# Patient Record
Sex: Male | Born: 1945 | Race: Black or African American | Hispanic: No | Marital: Married | Smoking: Former smoker
Health system: Southern US, Community
[De-identification: ages and names within clinical notes are randomized; demographics above are authoritative.]

## PROBLEM LIST (undated history)

## (undated) ENCOUNTER — Encounter

## (undated) ENCOUNTER — Ambulatory Visit: Payer: MEDICARE

## (undated) ENCOUNTER — Telehealth

## (undated) ENCOUNTER — Encounter: Attending: Surgery | Primary: Surgery

## (undated) ENCOUNTER — Telehealth: Attending: Adult Health | Primary: Adult Health

## (undated) ENCOUNTER — Encounter: Payer: MEDICARE | Attending: Urology | Primary: Urology

## (undated) ENCOUNTER — Encounter: Attending: Cardiovascular Disease | Primary: Cardiovascular Disease

## (undated) ENCOUNTER — Other Ambulatory Visit

## (undated) ENCOUNTER — Ambulatory Visit: Payer: Medicare (Managed Care)

## (undated) ENCOUNTER — Telehealth: Attending: Nurse Practitioner | Primary: Nurse Practitioner

## (undated) ENCOUNTER — Ambulatory Visit

## (undated) ENCOUNTER — Telehealth: Attending: Urology | Primary: Urology

## (undated) ENCOUNTER — Ambulatory Visit: Payer: MEDICARE | Attending: Cardiovascular Disease | Primary: Cardiovascular Disease

## (undated) ENCOUNTER — Encounter: Attending: Urology | Primary: Urology

## (undated) ENCOUNTER — Encounter: Attending: Internal Medicine | Primary: Internal Medicine

## (undated) ENCOUNTER — Encounter: Attending: Adult Health | Primary: Adult Health

## (undated) ENCOUNTER — Non-Acute Institutional Stay: Payer: MEDICARE

## (undated) ENCOUNTER — Encounter
Payer: MEDICARE | Attending: Student in an Organized Health Care Education/Training Program | Primary: Student in an Organized Health Care Education/Training Program

## (undated) ENCOUNTER — Ambulatory Visit: Payer: MEDICARE | Attending: Podiatrist | Primary: Podiatrist

## (undated) ENCOUNTER — Encounter: Payer: MEDICARE | Attending: Nurse Practitioner | Primary: Nurse Practitioner

## (undated) ENCOUNTER — Inpatient Hospital Stay: Payer: Medicare (Managed Care)

## (undated) ENCOUNTER — Encounter: Payer: MEDICARE | Attending: Cardiovascular Disease | Primary: Cardiovascular Disease

## (undated) ENCOUNTER — Encounter: Attending: Nurse Practitioner | Primary: Nurse Practitioner

## (undated) ENCOUNTER — Ambulatory Visit: Payer: MEDICARE | Attending: Urology | Primary: Urology

## (undated) ENCOUNTER — Encounter: Payer: MEDICARE | Attending: Podiatrist | Primary: Podiatrist

## (undated) ENCOUNTER — Telehealth: Attending: Family | Primary: Family

## (undated) ENCOUNTER — Ambulatory Visit: Payer: MEDICARE | Attending: Nurse Practitioner | Primary: Nurse Practitioner

## (undated) ENCOUNTER — Ambulatory Visit
Payer: MEDICARE | Attending: Student in an Organized Health Care Education/Training Program | Primary: Student in an Organized Health Care Education/Training Program

## (undated) ENCOUNTER — Telehealth: Attending: Internal Medicine | Primary: Internal Medicine

## (undated) ENCOUNTER — Encounter
Attending: Student in an Organized Health Care Education/Training Program | Primary: Student in an Organized Health Care Education/Training Program

## (undated) ENCOUNTER — Telehealth: Attending: Cardiovascular Disease | Primary: Cardiovascular Disease

## (undated) ENCOUNTER — Ambulatory Visit: Payer: MEDICARE | Attending: Adult Health | Primary: Adult Health

## (undated) ENCOUNTER — Encounter: Attending: Podiatrist | Primary: Podiatrist

## (undated) ENCOUNTER — Ambulatory Visit: Payer: Medicare (Managed Care) | Attending: Cardiovascular Disease | Primary: Cardiovascular Disease

## (undated) DIAGNOSIS — I10 Essential (primary) hypertension: Secondary | ICD-10-CM

## (undated) DIAGNOSIS — J449 Chronic obstructive pulmonary disease, unspecified: Secondary | ICD-10-CM

## (undated) DIAGNOSIS — I480 Paroxysmal atrial fibrillation: Secondary | ICD-10-CM

## (undated) DIAGNOSIS — E785 Hyperlipidemia, unspecified: Secondary | ICD-10-CM

## (undated) HISTORY — DX: Essential (primary) hypertension: I10

## (undated) HISTORY — DX: Paroxysmal atrial fibrillation: I48.0

## (undated) HISTORY — DX: Hyperlipidemia, unspecified: E78.5

---
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
## 2004-12-27 ENCOUNTER — Emergency Department: Payer: Self-pay | Admitting: Internal Medicine

## 2004-12-28 ENCOUNTER — Ambulatory Visit: Payer: Self-pay | Admitting: Internal Medicine

## 2005-03-14 ENCOUNTER — Ambulatory Visit: Payer: Self-pay | Admitting: Gastroenterology

## 2011-06-10 ENCOUNTER — Emergency Department: Payer: Self-pay | Admitting: Unknown Physician Specialty

## 2011-10-27 ENCOUNTER — Inpatient Hospital Stay: Payer: Self-pay | Admitting: Internal Medicine

## 2011-10-27 LAB — TROPONIN I: Troponin-I: <0.02 ng/mL

## 2011-10-27 LAB — URINALYSIS, COMPLETE
Bilirubin,UR: NEGATIVE
Glucose,UR: NEGATIVE mg/dL (ref 0–75)
Ketone: NEGATIVE
Nitrite: NEGATIVE
Ph: 6 (ref 4.5–8.0)
Protein: 100
RBC,UR: 277 /[HPF] (ref 0–5)
Specific Gravity: 1.026 (ref 1.003–1.030)
Squamous Epithelial: 3
WBC UR: 315 /[HPF] (ref 0–5)

## 2011-10-27 LAB — CK: CK, Total: 400 U/L — ABNORMAL HIGH (ref 35–232)

## 2011-10-28 LAB — CBC WITH DIFFERENTIAL/PLATELET
Basophil #: 0 10*3/uL (ref 0.0–0.1)
Basophil %: 0 %
Eosinophil #: 0 10*3/uL (ref 0.0–0.7)
Eosinophil %: 0.1 %
HCT: 29.9 % — ABNORMAL LOW (ref 40.0–52.0)
HGB: 9.7 g/dL — ABNORMAL LOW (ref 13.0–18.0)
Lymphocyte %: 9.4 %
Lymphs Abs: 1.7 10*3/uL (ref 1.0–3.6)
MCH: 25.7 pg — ABNORMAL LOW (ref 26.0–34.0)
MCHC: 32.3 g/dL (ref 32.0–36.0)
MCV: 79 fL — ABNORMAL LOW (ref 80–100)
Monocyte #: 1.9 10*3/uL — ABNORMAL HIGH (ref 0.0–0.7)
Monocyte %: 10.2 %
Neutrophil #: 14.8 10*3/uL — ABNORMAL HIGH (ref 1.4–6.5)
Neutrophil %: 80.3 %
Platelet: 184 10*3/uL (ref 150–440)
RBC: 3.77 10*6/uL — ABNORMAL LOW (ref 4.40–5.90)
RDW: 17.2 % — ABNORMAL HIGH (ref 11.5–14.5)
WBC: 18.4 10*3/uL — ABNORMAL HIGH (ref 3.8–10.6)

## 2011-10-28 LAB — BASIC METABOLIC PANEL
Anion Gap: 16 (ref 7–16)
BUN: 17 mg/dL (ref 7–18)
Calcium, Total: 7.5 mg/dL — ABNORMAL LOW (ref 8.5–10.1)
Chloride: 97 mmol/L — ABNORMAL LOW (ref 98–107)
Co2: 22 mmol/L (ref 21–32)
Creatinine: 1.5 mg/dL — ABNORMAL HIGH (ref 0.60–1.30)
EGFR (African American): 60 — ABNORMAL LOW
EGFR (Non-African Amer.): 49 — ABNORMAL LOW
Glucose: 125 mg/dL — ABNORMAL HIGH (ref 65–99)
Osmolality: 273 (ref 275–301)
Potassium: 3 mmol/L — ABNORMAL LOW (ref 3.5–5.1)
Sodium: 135 mmol/L — ABNORMAL LOW (ref 136–145)

## 2011-10-28 LAB — CK TOTAL AND CKMB (NOT AT ARMC)
CK, Total: 346 U/L — ABNORMAL HIGH (ref 35–232)
CK, Total: 353 U/L — ABNORMAL HIGH (ref 35–232)
CK-MB: 1.1 ng/mL (ref 0.5–3.6)
CK-MB: 2 ng/mL (ref 0.5–3.6)

## 2011-10-28 LAB — TROPONIN I
Troponin-I: <0.02 ng/mL
Troponin-I: <0.02 ng/mL

## 2011-10-29 LAB — BASIC METABOLIC PANEL
Anion Gap: 15 (ref 7–16)
BUN: 11 mg/dL (ref 7–18)
Calcium, Total: 8 mg/dL — ABNORMAL LOW (ref 8.5–10.1)
Chloride: 99 mmol/L (ref 98–107)
Co2: 20 mmol/L — ABNORMAL LOW (ref 21–32)
Creatinine: 1.19 mg/dL (ref 0.60–1.30)
EGFR (African American): >60
EGFR (Non-African Amer.): >60
Glucose: 103 mg/dL — ABNORMAL HIGH (ref 65–99)
Osmolality: 268 (ref 275–301)
Potassium: 3.3 mmol/L — ABNORMAL LOW (ref 3.5–5.1)
Sodium: 134 mmol/L — ABNORMAL LOW (ref 136–145)

## 2011-10-29 LAB — WBC: WBC: 13.2 10*3/uL — ABNORMAL HIGH (ref 3.8–10.6)

## 2011-10-29 LAB — URINE CULTURE

## 2012-09-13 ENCOUNTER — Telehealth: Payer: Self-pay | Admitting: *Deleted

## 2012-09-13 NOTE — Telephone Encounter (Signed)
Called patient to move his appointment up to 10/11/12 w/ dr. Graciela Husbands as we opened up a day for his schedule.

## 2012-10-01 ENCOUNTER — Inpatient Hospital Stay: Payer: Self-pay | Admitting: Internal Medicine

## 2012-10-01 LAB — COMPREHENSIVE METABOLIC PANEL
Albumin: 3.4 g/dL (ref 3.4–5.0)
Alkaline Phosphatase: 149 U/L — ABNORMAL HIGH (ref 50–136)
Anion Gap: 11 (ref 7–16)
BUN: 20 mg/dL — ABNORMAL HIGH (ref 7–18)
Bilirubin,Total: 1.2 mg/dL — ABNORMAL HIGH (ref 0.2–1.0)
Calcium, Total: 8.5 mg/dL (ref 8.5–10.1)
Chloride: 108 mmol/L — ABNORMAL HIGH (ref 98–107)
Co2: 18 mmol/L — ABNORMAL LOW (ref 21–32)
Creatinine: 1.17 mg/dL (ref 0.60–1.30)
EGFR (African American): >60
EGFR (Non-African Amer.): >60
Glucose: 114 mg/dL — ABNORMAL HIGH (ref 65–99)
Osmolality: 277 (ref 275–301)
Potassium: 4.1 mmol/L (ref 3.5–5.1)
SGOT(AST): 47 U/L — ABNORMAL HIGH (ref 15–37)
SGPT (ALT): 103 U/L — ABNORMAL HIGH (ref 12–78)
Sodium: 137 mmol/L (ref 136–145)
Total Protein: 7 g/dL (ref 6.4–8.2)

## 2012-10-01 LAB — CBC WITH DIFFERENTIAL/PLATELET
Basophil #: 0.1 10*3/uL (ref 0.0–0.1)
Basophil %: 1.2 %
Eosinophil #: 0 10*3/uL (ref 0.0–0.7)
Eosinophil %: 0.6 %
HCT: 38 % — ABNORMAL LOW (ref 40.0–52.0)
HGB: 12 g/dL — ABNORMAL LOW (ref 13.0–18.0)
Lymphocyte %: 38.5 %
Lymphs Abs: 2.5 10*3/uL (ref 1.0–3.6)
MCH: 25 pg — ABNORMAL LOW (ref 26.0–34.0)
MCHC: 31.5 g/dL — ABNORMAL LOW (ref 32.0–36.0)
MCV: 79 fL — ABNORMAL LOW (ref 80–100)
Monocyte #: 0.9 x10 3/mm (ref 0.2–1.0)
Monocyte %: 14.3 %
Neutrophil #: 3 10*3/uL (ref 1.4–6.5)
Neutrophil %: 45.4 %
Platelet: 246 10*3/uL (ref 150–440)
RBC: 4.78 10*6/uL (ref 4.40–5.90)
RDW: 18.2 % — ABNORMAL HIGH (ref 11.5–14.5)
WBC: 6.6 10*3/uL (ref 3.8–10.6)

## 2012-10-01 LAB — LIPID PANEL
Cholesterol: 128 mg/dL (ref 0–200)
HDL Cholesterol: 23 mg/dL — ABNORMAL LOW (ref 40–60)
Ldl Cholesterol, Calc: 86 mg/dL (ref 0–100)
Triglycerides: 95 mg/dL (ref 0–200)
VLDL Cholesterol, Calc: 19 mg/dL (ref 5–40)

## 2012-10-01 LAB — APTT: Activated PTT: 32.7 s (ref 23.6–35.9)

## 2012-10-01 LAB — CK TOTAL AND CKMB (NOT AT ARMC)
CK, Total: 104 U/L (ref 35–232)
CK, Total: 104 U/L (ref 35–232)
CK, Total: 82 U/L (ref 35–232)
CK-MB: 2.3 ng/mL (ref 0.5–3.6)
CK-MB: 2.1 ng/mL (ref 0.5–3.6)
CK-MB: 1.5 ng/mL (ref 0.5–3.6)

## 2012-10-01 LAB — TROPONIN I
Troponin-I: <0.02 ng/mL
Troponin-I: 0.03 ng/mL
Troponin-I: 0.03 ng/mL

## 2012-10-01 LAB — PRO B NATRIURETIC PEPTIDE: B-Type Natriuretic Peptide: 19584 pg/mL — ABNORMAL HIGH (ref 0–125)

## 2012-10-01 LAB — PROTIME-INR
INR: 1
Prothrombin Time: 13.7 s (ref 11.5–14.7)

## 2012-10-01 LAB — MAGNESIUM: Magnesium: 1.7 mg/dL — ABNORMAL LOW

## 2012-10-02 LAB — BASIC METABOLIC PANEL
Anion Gap: 8 (ref 7–16)
BUN: 22 mg/dL — ABNORMAL HIGH (ref 7–18)
Calcium, Total: 8.4 mg/dL — ABNORMAL LOW (ref 8.5–10.1)
Chloride: 108 mmol/L — ABNORMAL HIGH (ref 98–107)
Co2: 25 mmol/L (ref 21–32)
Creatinine: 1.48 mg/dL — ABNORMAL HIGH (ref 0.60–1.30)
EGFR (African American): 56 — ABNORMAL LOW
EGFR (Non-African Amer.): 48 — ABNORMAL LOW
Glucose: 110 mg/dL — ABNORMAL HIGH (ref 65–99)
Osmolality: 285 (ref 275–301)
Potassium: 3.9 mmol/L (ref 3.5–5.1)
Sodium: 141 mmol/L (ref 136–145)

## 2012-10-02 LAB — CBC WITH DIFFERENTIAL/PLATELET
Basophil #: 0 10*3/uL (ref 0.0–0.1)
Basophil %: 0.7 %
Eosinophil #: 0.1 10*3/uL (ref 0.0–0.7)
Eosinophil %: 1.9 %
HCT: 40.3 % (ref 40.0–52.0)
HGB: 12.7 g/dL — ABNORMAL LOW (ref 13.0–18.0)
Lymphocyte %: 35.8 %
Lymphs Abs: 2.1 10*3/uL (ref 1.0–3.6)
MCH: 25 pg — ABNORMAL LOW (ref 26.0–34.0)
MCHC: 31.6 g/dL — ABNORMAL LOW (ref 32.0–36.0)
MCV: 79 fL — ABNORMAL LOW (ref 80–100)
Monocyte #: 0.8 x10 3/mm (ref 0.2–1.0)
Monocyte %: 14 %
Neutrophil #: 2.8 10*3/uL (ref 1.4–6.5)
Neutrophil %: 47.6 %
Platelet: 247 10*3/uL (ref 150–440)
RBC: 5.11 10*6/uL (ref 4.40–5.90)
RDW: 18 % — ABNORMAL HIGH (ref 11.5–14.5)
WBC: 6 10*3/uL (ref 3.8–10.6)

## 2012-10-02 LAB — HEPATIC FUNCTION PANEL A (ARMC)
Albumin: 3.3 g/dL — ABNORMAL LOW (ref 3.4–5.0)
Alkaline Phosphatase: 138 U/L — ABNORMAL HIGH (ref 50–136)
Bilirubin, Direct: 0.3 mg/dL — ABNORMAL HIGH (ref 0.00–0.20)
Bilirubin,Total: 0.9 mg/dL (ref 0.2–1.0)
SGOT(AST): 32 U/L (ref 15–37)
SGPT (ALT): 87 U/L — ABNORMAL HIGH (ref 12–78)
Total Protein: 7 g/dL (ref 6.4–8.2)

## 2012-10-02 LAB — MAGNESIUM: Magnesium: 1.9 mg/dL

## 2012-10-03 LAB — BASIC METABOLIC PANEL
Anion Gap: 9 (ref 7–16)
BUN: 21 mg/dL — ABNORMAL HIGH (ref 7–18)
Calcium, Total: 8.4 mg/dL — ABNORMAL LOW (ref 8.5–10.1)
Chloride: 108 mmol/L — ABNORMAL HIGH (ref 98–107)
Co2: 22 mmol/L (ref 21–32)
Creatinine: 1.27 mg/dL (ref 0.60–1.30)
EGFR (African American): >60
EGFR (Non-African Amer.): 58 — ABNORMAL LOW
Glucose: 102 mg/dL — ABNORMAL HIGH (ref 65–99)
Osmolality: 281 (ref 275–301)
Potassium: 3.8 mmol/L (ref 3.5–5.1)
Sodium: 139 mmol/L (ref 136–145)

## 2012-10-19 ENCOUNTER — Encounter: Payer: Self-pay | Admitting: *Deleted

## 2012-10-23 ENCOUNTER — Ambulatory Visit: Payer: Self-pay | Admitting: Internal Medicine

## 2013-06-04 ENCOUNTER — Ambulatory Visit: Payer: Self-pay | Admitting: Urology

## 2013-06-04 LAB — CBC WITH DIFFERENTIAL/PLATELET
Basophil #: 0.1 10*3/uL (ref 0.0–0.1)
Basophil %: 1.1 %
Eosinophil #: 0.1 10*3/uL (ref 0.0–0.7)
Eosinophil %: 2.1 %
HCT: 37.6 % — ABNORMAL LOW (ref 40.0–52.0)
HGB: 12.1 g/dL — ABNORMAL LOW (ref 13.0–18.0)
Lymphocyte %: 44.2 %
Lymphs Abs: 2.3 10*3/uL (ref 1.0–3.6)
MCH: 27.6 pg (ref 26.0–34.0)
MCHC: 32.1 g/dL (ref 32.0–36.0)
MCV: 86 fL (ref 80–100)
Monocyte #: 0.6 x10 3/mm (ref 0.2–1.0)
Monocyte %: 10.9 %
Neutrophil #: 2.2 10*3/uL (ref 1.4–6.5)
Neutrophil %: 41.7 %
Platelet: 193 10*3/uL (ref 150–440)
RBC: 4.37 10*6/uL — ABNORMAL LOW (ref 4.40–5.90)
RDW: 17.2 % — ABNORMAL HIGH (ref 11.5–14.5)
WBC: 5.2 10*3/uL (ref 3.8–10.6)

## 2013-06-24 ENCOUNTER — Ambulatory Visit: Payer: Self-pay | Admitting: Urology

## 2013-06-26 LAB — PATHOLOGY REPORT

## 2013-10-10 ENCOUNTER — Ambulatory Visit: Payer: Self-pay | Admitting: Urology

## 2014-10-10 IMAGING — US ABDOMEN ULTRASOUND LIMITED
1 series · 14 of 25 positions shown · non-contrast
Comparison: none

REASON FOR EXAM: elevated transminases
COMMENTS:   Body Site: GB and Fossa, CBD, Head of Pancreas

PROCEDURE:     US  - US ABDOMEN LIMITED SURVEY  - October 01, 2012  [DATE]
RESULT:     Comparison: None.
TECHNIQUE: Multiple grayscale and color Doppler images were obtained of the
right upper quadrant.

[Series 1: abdomen ultrasound limited · 0.30mm/px · 14 of 83 slices shown]
[im 1/83]
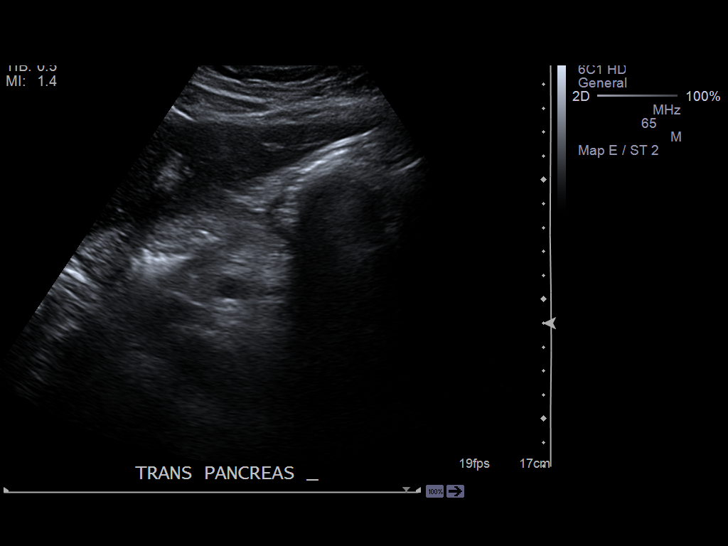
[im 7/83]
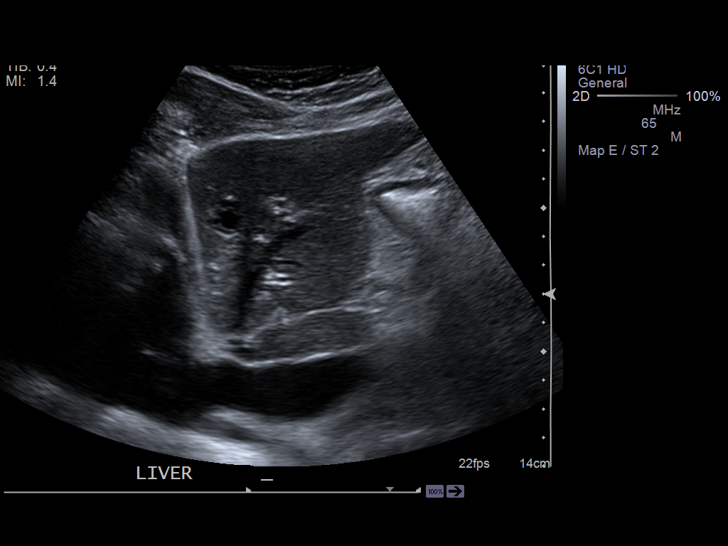
[im 14/83]
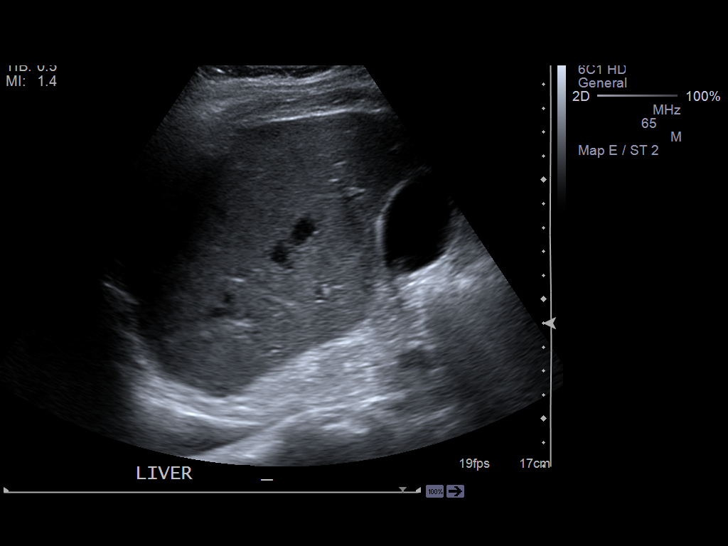
[im 21/83]
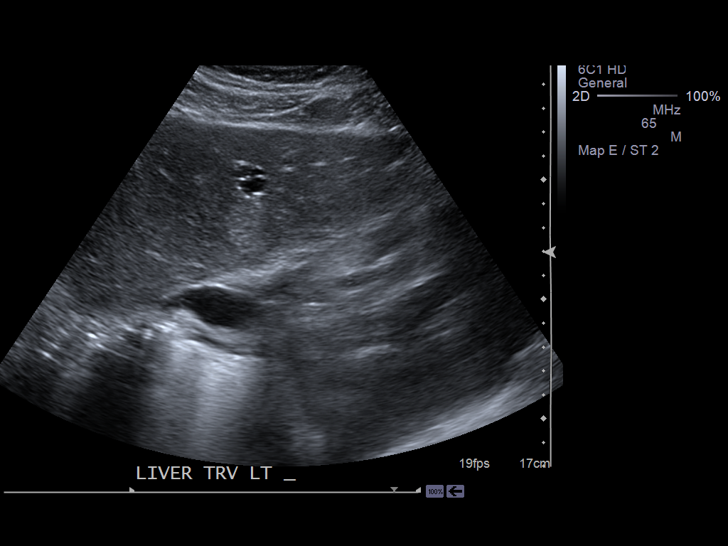
[im 28/83]
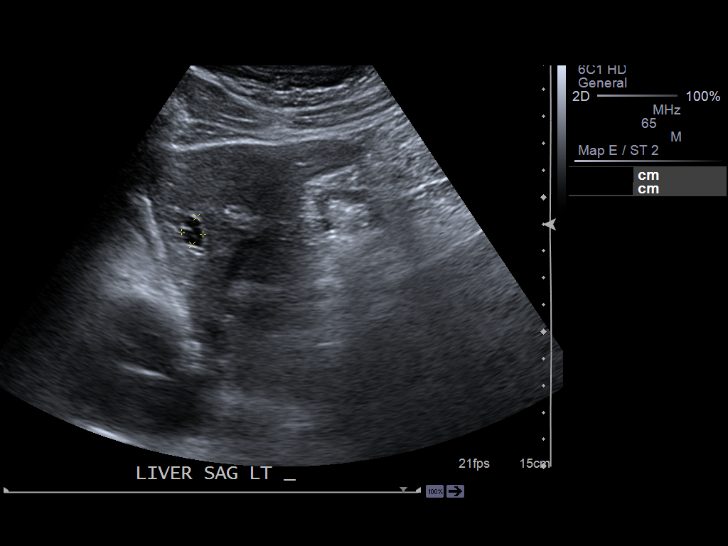
[im 31/83]
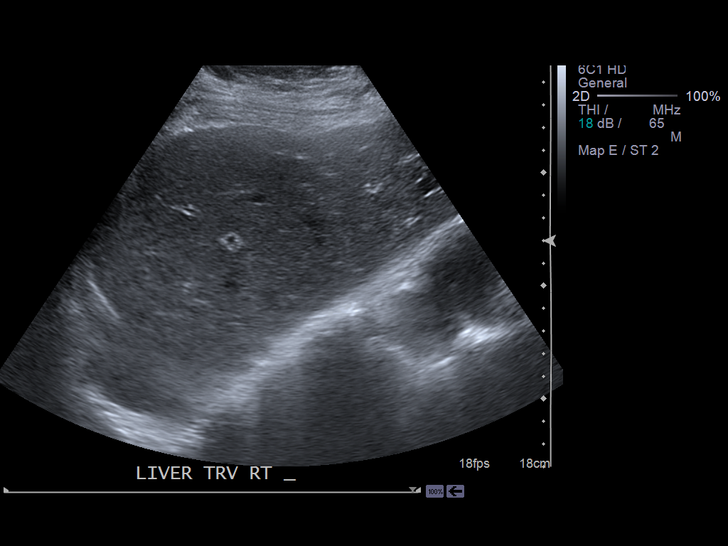
[im 38/83]
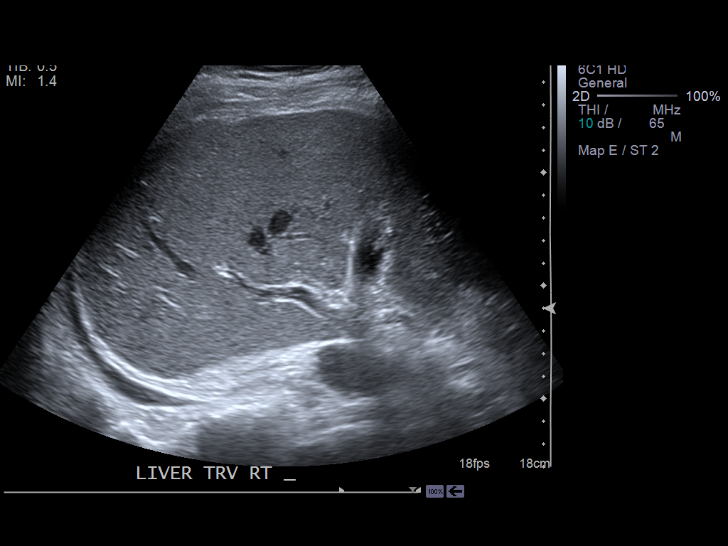
[im 45/83]
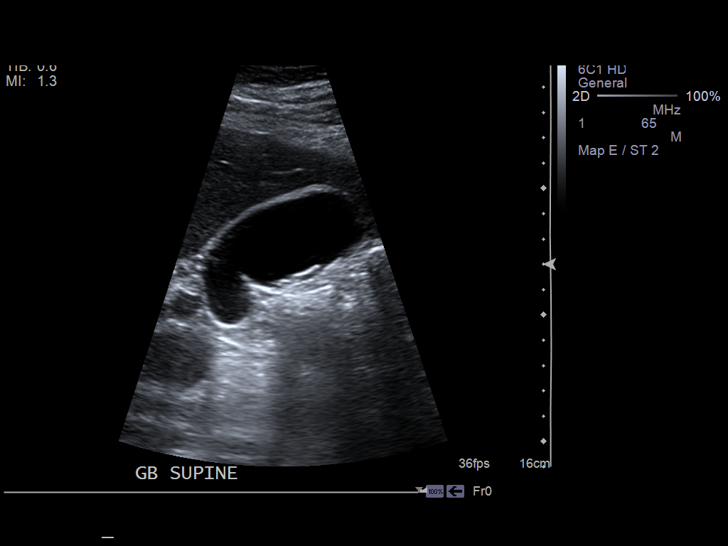
[im 52/83]
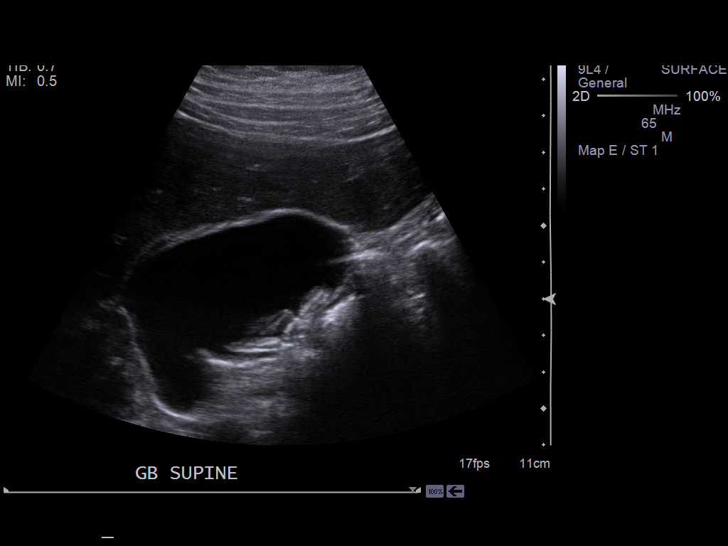
[im 55/83]
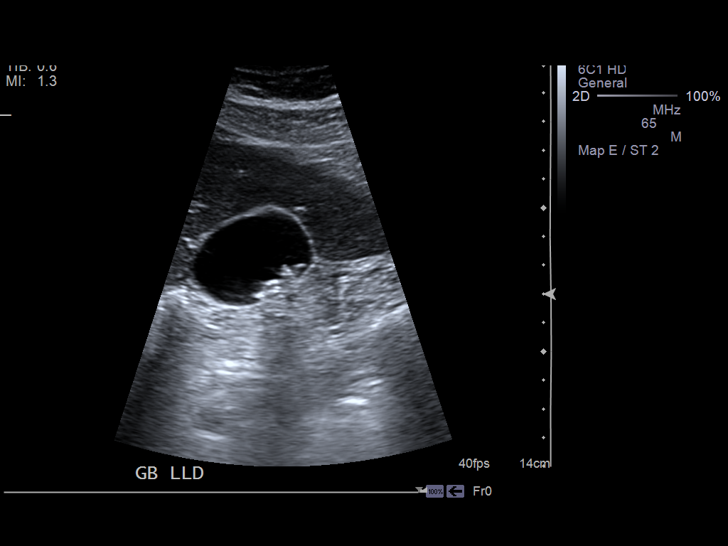
[im 62/83]
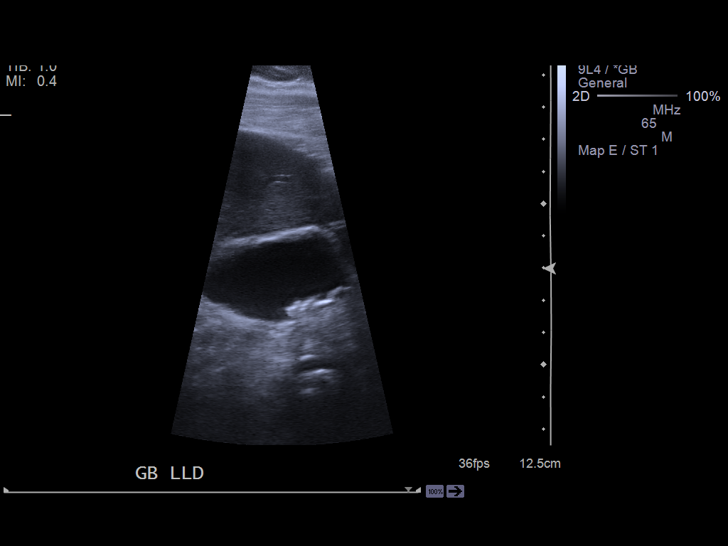
[im 69/83]
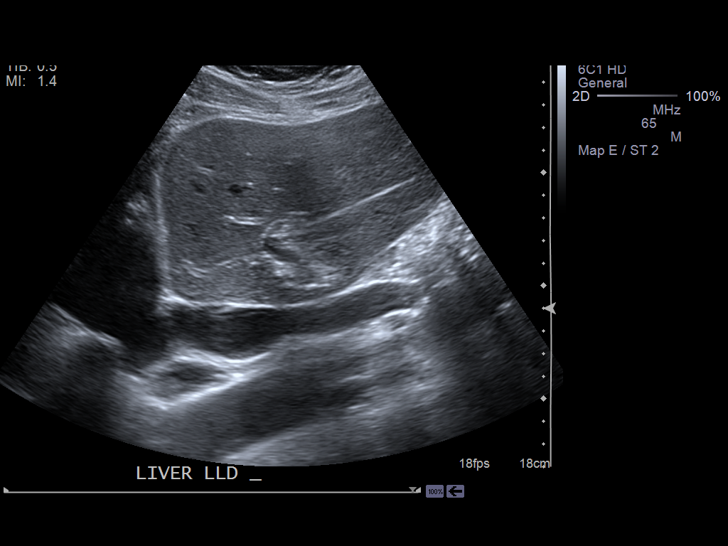
[im 76/83]
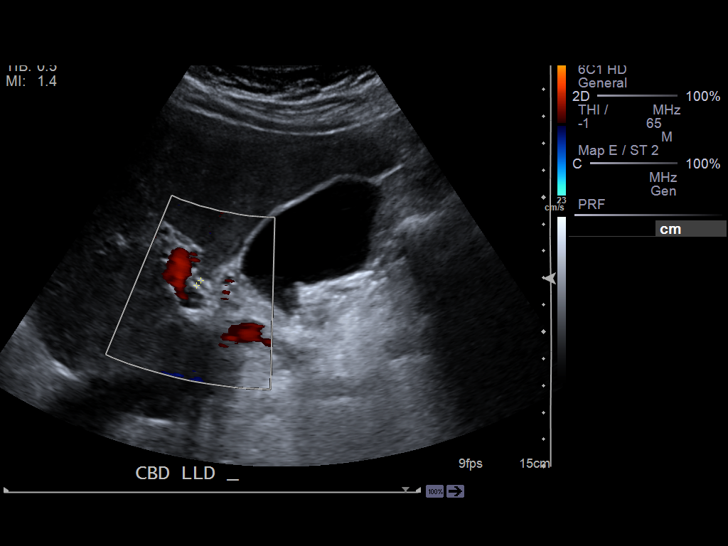
[im 83/83]
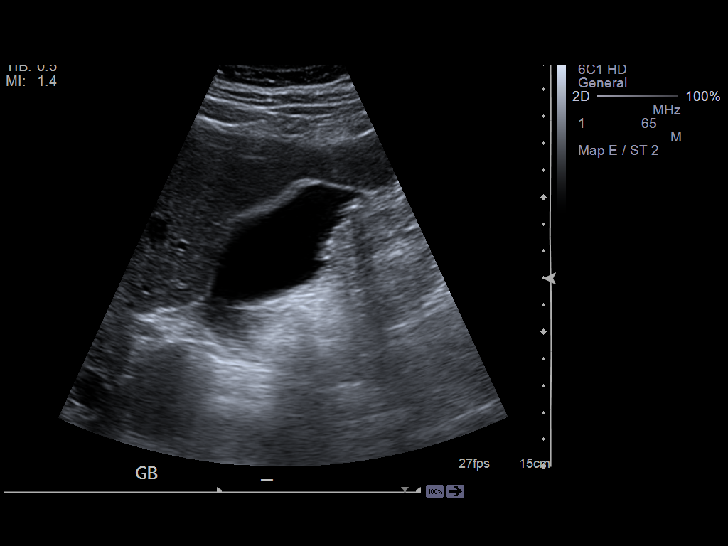

[14 of 25 positions shown; findings below may reference images not displayed]

FINDINGS: The pancreas was mostly obscured by overlying bowel gas. There are 2 small
cysts versus a cyst with thin septation in the left hepatic lobe. It
measures 1.2 x 1.0 x 0.8 cm. Several echogenic foci are identified the
gallbladder. By report, these were mobile, and likely represent nonshadowing
gallstones. No gallbladder wall thickening or pericholecystic fluid. The
sonographic Murphy sign was negative. The common bile duct measures 2 mm in
diameter.

There is a cyst in the superior pole of the right kidney. The right pleural
effusion is incompletely visualized.
IMPRESSION: Cholelithiasis, without sonographic evidence of acute cholecystitis.

[REDACTED]

## 2014-12-12 NOTE — H&P (Signed)
PATIENT NAME:  Drew Walker, Drew Walker MR#:  [id number] DATE OF BIRTH:  1946-07-16  DATE OF ADMISSION:  06/10/2013  CHIEF COMPLAINT: Bloody urine.   HISTORY OF PRESENT ILLNESS: Mr. 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 Upper tract evaluation with IVP indicated normal kidneys. He comes in now for transurethral resection of bladder tumor. Most recent PSA was 6.4. The patient has a history of BPH with LUTZ and is status post TUMT in 2013.   ALLERGIES: No known drug allergies.  CURRENT MEDICATIONS: Include iron, doxycycline, Eliquis, aspirin, metoprolol, diltiazem ER, Cialis, atorvastatin and lisinopril.   PAST SURGICAL HISTORY: Include TUMT in 2013.   SOCIAL HISTORY: The patient smokes a pack a day and has a greater than 30 pack-year history. He denied alcohol use.   FAMILY HISTORY: Remarkable for diabetes and prostate cancer.   PAST AND CURRENT MEDICAL CONDITIONS:  1.  Hypertension.  2.  Diverticulosis.  3.  Erectile dysfunction.  4.  Hypercholesterolemia.   REVIEW OF SYSTEMS: The patient denies chest pain, heart disease, stroke or diabetes.   PHYSICAL EXAMINATION: GENERAL: A well-nourished African American male in no distress.  HEENT: Sclerae were clear. Pupils were equally round and reactive to light and accommodation. Extraocular movements were intact.  NECK: Supple. No palpable masses or tenderness. Thyroid gland was smooth without palpable nodules. No audible carotid bruits.  LUNGS: Clear to auscultation.  HEART: Regular rhythm and rate without audible murmurs.  ABDOMEN: Soft, nontender abdomen.  GENITOURINARY: Uncircumcised. Testes atrophic, 12 mL in size each.  RECTAL: 40 grams smooth nontender prostate.  NEUROMUSCULAR: Alert and oriented x 3.   IMPRESSION: Superficial bladder cancer.   PLAN:  Transurethral resection of bladder tumor. ____________________________ Drew Walker. Drew CroonWolff, MD mrw:sb D: 06/04/2013 13:09:39 ET T: 06/04/2013 13:34:03 ET JOB#: [id number]  cc: Drew Walker. Drew CroonWolff, MD, <Dictator> Drew ApeMICHAEL Walker Katai Marsico MD ELECTRONICALLY SIGNED 06/04/2013 14:21

## 2014-12-12 NOTE — H&P (Signed)
PATIENT NAME:  Drew Walker, Drew Walker MR#:  [id number] DATE OF BIRTH:  [birth year]  DATE OF ADMISSION:  10/01/2012  REFERRING PHYSICIAN: Dr. Malachy Moan.  PRIMARY CARE PHYSICIAN: Used to be Dr. Marguerite Olea, but the patient currently has no primary care physician. He is supposed to be following with Dr. Randa Lynn, but has not seen him yet.   PRIMARY CARDIOLOGIST: Dr. Marcina Millard.   CHIEF COMPLAINT: Shortness of breath, palpitations and chest tightness.   HISTORY OF PRESENT ILLNESS: This is a 69 year old male with significant past medical history of paroxysmal atrial fibrillation, hypertension and BPH. The patient was diagnosed with A. fib in March 2013 where he had CHADS2 score of 1, was on aspirin, heart rate was controlled on beta blockers, metoprolol 100% p.o. b.i.d. The patient presents today with complaints of palpitations, heart racing, chest discomfort and shortness of breath mainly upon lying supine. It started this evening. The patient was noted to be in A. fib with RVR. At one point, heart rate was in the 140s to 150s on elmetry monitor in the Emergency Department, despite receiving 2 doses of IV metoprolol. The patient had elevated proBNP. Chest x-ray did show evidence of volume overload and vascular congestion. As well, he was short of breath requiring oxygen. The patient had complaints of some chest discomfort. He was given 324 mg of aspirin. His first troponin was negative. The patient had seen Dr. Darrold Junker recently, where he had treadmill stress test where he reached 85% of targeted heart rate without any evidence of ischemia. The patient denies any chest pain in particular and described it as more a chest tightness due to the shortness of breath and he reports his shortness of breath is more upon lying supine where he has to sit up, but reports all of these symptoms are currently resolved. The patient had elevated d-dimer at 0.89, but family was concerned about his kidney function, so they  did not want him to have CT chest with IV contrast, but they opted for VQ scan. Hospitalist service was requested to admit the patient for further management of his atrial fibrillation with RVR and heart failure. The patient denies any fever, chills, coffee-ground emesis, bright red blood per rectum, cough or any productive sputum.   PAST MEDICAL HISTORY:  1.  Paroxysmal atrial fibrillation.  2.  Hypertension.  3.  Benign prostatic hypertrophy.   ALLERGIES: No known drug allergies.   SOCIAL HISTORY:  The patient quit smoking 3 weeks ago. He used to smoke about a pack to pack and a half every day. He works as a [employer] and he is still working. No alcohol abuse. No illicit drug abuse. Lives at home with the family.   FAMILY HISTORY: Significant for colon cancer and diabetes.   HOME MEDICATIONS: Aspirin 81 mg oral daily, metoprolol 100 mg oral p.o. b.i.d. and Imdur 30 mg oral daily.   REVIEW OF SYSTEMS:  CONSTITUTIONAL: Denies any fever or chills, weight gain, weight loss, fatigue or weakness.  EYES: Denies blurry vision, double vision, inflammation, glaucoma or cataracts.  ENT: Denies tinnitus, ear pain, hearing loss, epistaxis, discharge or snoring.  RESPIRATORY: Denies any cough, wheezing or hemoptysis. Has dyspnea upon lying supine and dyspnea on exertion. Denies any COPD or asthma.  CARDIOVASCULAR: Complains of chest tightness. Denies any syncope, has complaints of palpitations, heart racing and mild lower extremity edema.  GASTROINTESTINAL: Denies nausea, vomiting, diarrhea, abdominal pain, hematemesis, melena, jaundice or rectal bleed.  GENITOURINARY: Denies dysuria, hematuria or renal colic.  ENDOCRINE: Denies polyuria, polydipsia or heat or cold intolerance.  HEMATOLOGY: Denies anemia, easy bruising or bleeding diathesis.  INTEGUMENTARY: Denies acne, rash or skin lesions.  MUSCULOSKELETAL: Denies neck pain, shoulder pain, knee pain, arthritis, cramps or gout.  NEUROLOGIC: Denies  dysarthria, epilepsy, tremors, vertigo, ataxia, CVA, TIA or seizures.  PSYCHIATRIC: Denies anxiety, insomnia, substance abuse, alcohol abuse, bipolar disorder, schizophrenia or depression.   PHYSICAL EXAMINATION:  VITAL SIGNS: Temperature 97.8, pulse 107, respiratory rate 19, blood pressure 128/98, saturating 98% on oxygen.  GENERAL: Well-nourished male, who looks comfortable and in no apparent distress.  HEENT: Head atraumatic, normocephalic. Pupils equal and reactive to light. Pink conjunctivae. Anicteric sclerae. Moist oral mucosa.  NECK: Supple. No thyromegaly. No JVD.  CHEST: Good air entry bilaterally. No wheezing, rales or rhonchi.  CARDIOVASCULAR: S1, S2 heard. No rubs, murmurs, gallops. Irregular rhythm. Tachycardic.  ABDOMEN: Soft, nontender, and nondistended. No organomegaly.  EXTREMITIES: Mild pitting edema. No clubbing. No cyanosis. No calf tenderness.  SKIN: Normal skin turgor. Warm and dry. No rash.  PSYCHIATRIC: Appropriate affect. Awake and alert x 3. Intact judgment and insight.  NEUROLOGIC: Cranial nerves grossly intact. Motor 5 out of 5. No focal deficits. Sensation symmetrical and intact.   PERTINENT LABORATORY: Glucose 114. BNP 19,584. BUN 20, creatinine 1.17, sodium 137, potassium 4.1, chloride 108, CO2 18. Total bilirubin 1.2, alkaline phosphatase 149, AST 47, ALT 103. Troponin less than 0.02. White blood cell 6.6, hemoglobin 12, hematocrit 38, platelets 246. D-dimer 0.89. EKG showing atrial fibrillation with RVR at 104 beats per minute with new changes and of T wave inversion in lead V6 and aVF from the last EKG in October 2012.   ASSESSMENT AND PLAN:  1.  Atrial fibrillation with rapid ventricular rate. The patient is known to have history of paroxysmal atrial fibrillation. We will continue with metoprolol. The patient was given IV metoprolol in the ED x 2, but still was in rapid ventricular rate. We will continue the patient on his home metoprolol. We will add p.o.  Cardizem. We will give him 1 dose of IV Cardizem now 10 mg and will try to manage with IV Cardizem as needed pushes. Meanwhile, we will continue  with aspirin. We will consult cardiology, Dr. Darrold JunkerParaschos as he is very familiar with the patient. We will leave the decision for anticoagulation up to him. 2.  Congestive heart failure. The patient's last echocardiogram was done in March of last year. He is not known to have history of congestive heart failure. This is most likely related to his atrial fibrillation with rapid ventricular rate. The patient has evidence of vascular congestion in the lung as well as elevated proBNP. We will start him on diuresis. We will start gently intravenous every  12 hours. We will repeat the echocardiogram. We will continue to cycle troponins.  3.  Hypertension, acceptable. We will continue with metoprolol. Will discontinue Imdur as we are adding Cardizem and Lasix.  4.  Elevated liver function tests. This is most likely related to hepatic congestion from his heart failure. Will monitor closely. If no improvement after diuresis, we will check ultrasound of the liver. We will repeat liver function tests in the a.m.  5.  Elevated d-dimer. Discussed with family and at this point they are concerned about ordering  CT of the chest with intravenous contrast because of his kidney function and they are choosing to go with a V/Q scan, so we will order a VQ scan. As well, we will order bilateral lower  extremity venous Dopplers as well.  6.  Deep vein thrombosis prophylaxis. Subcutaneous heparin. 7.  Gastrointestinal prophylaxis. Protonix.  8.  CODE STATUS: Full code.   TOTAL TIME SPENT ON ADMISSION AND PATIENT CARE: 65 minutes.   ____________________________ Starleen Arms, MD dse:aw D: 10/01/2012 07:39:45 ET T: 10/01/2012 08:16:44 ET JOB#: [id number]  cc: Starleen Arms, MD, <Dictator> Jadalee Westcott Teena Irani MD ELECTRONICALLY SIGNED 10/02/2012 1:00

## 2014-12-12 NOTE — Consult Note (Signed)
   General Aspect 69 yo male with history of paroxysmal afib who was admitted with progressive shortness of breath and rapid heart rate. He has been treated with cardizem nd more recently metoprolol.  Pt and his wife statae that he gets shortness of breath after taking po metoprolol. He has been traveling recently and had metoprolol added to his regimen to treat his afib. On presentation to [facility] he had shortness of breath and was noted to be in afib with rvr. He has ruled out for an mi. Echo done today revealed reduced lv funciton with ef less than 40%. He is on asa alone for anticoagulation. Had a chadss score of 1 for hypertension but now has chf which makes his chadss score to 2. He is scheduled ot see Gerre PebblesKevin Thomas for consideraiton for ablation.   Physical Exam:  GEN no acute distress, thin   HEENT PERRL, hearing intact to voice   NECK supple   RESP no use of accessory muscles  rhonchi   CARD Irregular rate and rhythm  Murmur   Murmur Systolic   Systolic Murmur axilla   ABD denies tenderness  no hernia  normal BS   LYMPH negative neck   EXTR negative cyanosis/clubbing, positive edema   SKIN normal to palpation, No rashes   NEURO cranial nerves intact, motor/sensory function intact   PSYCH A+O to time, place, person   Review of Systems:  Subjective/Chief Complaint shortness of breath   General: Fatigue  Weakness   Skin: No Complaints   ENT: No Complaints   Eyes: No Complaints   Neck: No Complaints   Respiratory: Short of breath   Cardiovascular: Palpitations  Dyspnea   Gastrointestinal: No Complaints   Genitourinary: No Complaints   Vascular: No Complaints   Musculoskeletal: No Complaints   Neurologic: No Complaints   Hematologic: No Complaints   Endocrine: No Complaints   Psychiatric: No Complaints   Review of Systems: All other systems were reviewed and found to be negative   Medications/Allergies Reviewed Medications/Allergies reviewed    EKG:  Interpretation afib with variable ventricular response    No Known Allergies:    Impression 69 yo male with history of afib who is now treated with cardizem and meotprolol. Per patient and his wife, he complains of shortness of breath when recieving metoprolol. He appears to tolerate cardizem well. He states he is scheduled for an appointment with Dr. Gerre PebblesKevin Thomas for consideration for an afib ablation. He is in afib with variable ventricular response. Echo done with this admission suggests ef is less than 40% at 25-35%. Pt is on asa for antiplatelet therapy at present. He has a chadss score of 2. Consideration for more aggressive anticoagulation therapy other than asa needs to be addressed. Also, Antiarrythmic therapy such as dofetalide or amiodarone given reduced lv funciton needs to be considered. Will discuss with primary cardiolgist.   Plan 1. Careful diuresis. 2. Conitnue with cardizem 3. Discontinue metoprolol 4. Consider digoxin for rate control 5. Consider chornic anticoagulation with coumadin vs novel anticoagulants. 6. Consider dofetalide vs amiodarone for thryhm control   Electronic Signatures: Dalia HeadingFath, Kenneth A (MD)  (Signed 10-Feb-14 21:22)  Authored: General Aspect/Present Illness, History and Physical Exam, Review of System, EKG , Allergies, Impression/Plan   Last Updated: 10-Feb-14 21:22 by Dalia HeadingFath, Kenneth A (MD)

## 2014-12-12 NOTE — Discharge Summary (Signed)
PATIENT NAME:  Drew Walker, FERRUFINO MR#:  [id number] DATE OF BIRTH:  Mar 25, 1946  DATE OF ADMISSION:  10/01/2012 DATE OF DISCHARGE:  10/03/2012  ADMITTING DIAGNOSES:   1.  Atrial fibrillation, rapid ventricular response.  2.  Congestive heart failure.  3.  Hypertension.  4.  Elevated liver function tests.   DISCHARGE DIAGNOSES:  1.  Atrial fibrillation with rapid ventricular response.  2. Congestive heart failure, left heart, acute systolic with known cardiomyopathy with ejection fraction of 25% to 35% on echo, but normal ejection fraction of Myoview.  3. Abnormal Myoview with suspected anteroseptal lateral wall ischemia.  Ejection fraction of 55%.  4.  Elevated transaminases.  5.  Renal insufficiency with diuretic use, resolved.  6.  Suspected peripheral vascular disease.  7.  Hyperglycemia.  8.  Anemia, microcytic.  9.  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  10.  History of hypertension.  11.  Paroxysmal atrial fibrillation.  12.  Benign prostatic hypertrophy.   DISCHARGE CONDITION:  Stable.   DISCHARGE MEDICATIONS:  The patient is to continue:  1.  Aspirin 81 mg 2 tablets once daily.  2.  Imdur 30 mg by mouth daily.  3.  Metoprolol tartrate 50 mg by mouth twice daily.  4.  Lisinopril 2.5 mg by mouth daily.  5.  Cardizem CD 120 mg by mouth once daily.   HOME OXYGEN:  None.   DIET:  2 gram salt, low fat, low cholesterol, regular consistency.   ACTIVITY LIMITATIONS:  As tolerated.   FOLLOW-UP APPOINTMENT:  To establish primary care physician with Dr. Randa Lynn in two days after discharge.  The patient is also to follow up with cardiology of his choice in one week after discharge.     CONSULTANTS:  Dr. Lady Gary, Dr. Darrold Junker.   RADIOLOGIC STUDIES:  Chest portable single view, 10/01/2012, revealed mild congestive heart failure.  Lung V/Q scan done 10/01/2012, revealed low probability for pulmonary embolus.  Ultrasound of lower extremities bilateral 10/01/2012 revealed no  evidence of DVT in bilateral lower extremities.  Ultrasound of abdomen, limited survey, 10/01/2012, due to elevated transaminases showed cholelithiasis without scintigraphic evidence of acute cholecystitis.  Myoview stress test 10/03/2012 revealed a normal left ventricular function, normal wall motion, anteroseptal lateral wall ischemia, according to cardiologist.  Echocardiogram 10/01/2012 revealed left atrium moderately dilated.  Right atrium mildly dilated.  Ejection fraction of 25% to 35%.  Left ventricular systolic function is moderately reduced.  There is mild mitral regurgitation.  The aortic valve is normal in structure and function.  Mitral valves were grossly normal.  There is mild tricuspid regurgitation.   HOSPITAL COURSE:  The patient is a 69 year old African American male with past medical history significant for history of paroxysmal atrial fibrillation who presented to the hospital with complaints of shortness of breath as well as palpitations and chest tightness.  Please refer to Dr. Teena Irani admission note on 10/01/2012.  On arrival to the hospital, patient's temperature was 97.8, pulse 107, respiration rate was 19, blood pressure 128/98, saturation was 98% on oxygen therapy.  The patient was not in acute distress.  His physical exam revealed mild pitting edema in lower extremity exam.  His chest showed good air entrance bilaterally, but no significant wheezing, rales or rhonchi.  Otherwise, physical exam was unremarkable.  The patient's lab data done on the day of admission 10/01/2012 showed elevated Beta-type natriuretic peptide of 19, BNP 19,584.  The patient's glucose level was 114, BUN was 20. Bicarbonate level was low at  18; otherwise, BMP was unremarkable.  The patient's magnesium level was low at 1.7.  The patient's liver enzymes revealed, however, total bilirubin of 1.2, alkaline phosphatase 149, AST 47, ALT 103.  Cardiac enzymes x 3 did not show any abnormalities.  White blood cell  count was normal at 6.6, hemoglobin was 12.0, platelet count 246, MCV was low at 79, absolute neutrophil count was within normal limits.  The patient's coagulation panel was unremarkable.  D-dimer was slightly elevated at 0.89.  EKG showed atrial fibrillation, rate 104, minimal voltage criteria for LVH, may be normal variant, T wave abnormality. Consider inferior ischemia or digitalis effect according to EKG criteria.  T wave inversions were evident in the inferior as well as lateral leads.  Chest x-ray showed mild congestive heart failure.  The patient was admitted to the hospital.  His cardiac enzymes were cycled x 3 and cardiology consultation with Dr. Lady GaryFath as well as Dr. Darrold JunkerParaschos where obtained.  The cardiologist followed patient along.  It was difficult to discuss the patient's care with patient's family as they were very reluctant to do any kind of studies.  The patient was advised to proceed to cardiac catheterization, especially since he had a history of elevation of troponins during his prior hospitalization with A-Fib.  He also was having some chest discomfort.  After a long discussion with Dr. Darrold JunkerParaschos, family was agreeable for stress test to be repeated.  The patient's stress test showed some changes in his anterior wall which were concerning for possible ischemic changes.  Dr. Darrold JunkerParaschos consulted patient, then advised him to undergo cardiac catheterization; however, patient's family refused that.  Since patient did not have any anginal symptoms and was able to ambulate with no significant discomfort, and his cardiac enzymes remained stable, Dr. Darrold JunkerParaschos recommended to follow up with cardiology of his choice in the next few weeks after discharge.  Dr. Darrold JunkerParaschos will no longer take care of this patient due to major trust issues from the family; however, Dr. Darrold JunkerParaschos felt there was no need for emergent cardiac catheterization.  The patient is being discharged home today on 10/03/2012.  He is to follow  up with Dr. Randa LynnLamb and make decisions about cardiologist, who he wants to follow up with in the next few weeks after discharge.  On day of discharge, 10/03/2012, the patient's vitals, temperature 97.9, pulse ranging from 90s to 106, respirations were 16 to 18, blood pressure 110/76, saturation was 98% on room air at rest.  In regards to atrial fibrillation, patient is to continue Cardizem as well as metoprolol according to Dr. Darrold JunkerParaschos' recommendation.  He is to continue aspirin therapy and he is to follow up with cardiologist for further recommendations and make decisions about anticoagulation if needed.  In regards to cardiomyopathy, the patient's echocardiogram revealed ejection fraction of 25% to 35%; however, patient's Myoview revealed ejection fraction which was completely within normal limits at 55%.  Due to echocardiogram being slightly abnormal, lisinopril was started at lower dose. Should be taken at bedtime.  For elevated transaminases, there was no cholecystitis clinically or radiologically.  Gallstones were noted only on ultrasound of his abdomen.  The patient had hepatitis panel which revealed old hepatitis B infection.  The patient is to follow up with his primary care physician, Dr. Randa LynnLamb, and make decisions about gastroenterology followup as needed.  For suspected peripheral vascular disease, patient overall would benefit from vascular followup in the future.  He was noted to have somewhat decreased peripheral pulses.  For  history of tobacco abuse, patient quit a few weeks ago and he was offered nicotine replacement therapy; however, he refused.  He stated that he completely quit cold [place] and he did not want to restart that.  The patient was noted to be anemic; however, patient's hemoglobin levels remained stable during his stay in the hospital time and he was not noted to have any bleeding.  It is recommended to follow patient's hemoglobin levels as outpatient and make sure that they remain stable  since he is on aspirin therapy.  On day of discharge, 10/03/2012, patient's hemoglobin level is 12.7.  The patient was evaluated for risk factors for coronary artery disease.  He was noted to have LDL of 86.  The patient's total cholesterol was 138, triglycerides were 95 and HDL was 23.  The patient was advised to continue low fat, low cholesterol diet.  The patient is being discharged in stable condition with above-mentioned medications and follow-up.   TIME SPENT:  40 minutes.     ____________________________ Katharina Caper, MD rv:ea D: 10/06/2012 13:33:26 ET T: 10/07/2012 05:43:44 ET JOB#: [id number]  cc: Katharina Caper, MD, <Dictator> Dr. Randa Lynn Outside Physician   Kais Monje MD ELECTRONICALLY SIGNED 10/14/2012 18:03

## 2014-12-12 NOTE — Consult Note (Signed)
PATIENT NAME:  Drew Walker, Drew Walker MR#:  [id number] DATE OF BIRTH:  28-Mar-1946  DATE OF CONSULTATION:  10/03/2012  REFERRING PHYSICIAN:   CONSULTING PHYSICIAN:  Marcina MillardAlexander Arial Galligan, MD  INTERIM NOTE  SUMMARY: The patient is a 69 year old gentleman with history of paroxysmal atrial fibrillation that was treated with metoprolol. The patient was recently [place] where he had an elevated troponin and cardiac catheterization was suggested. The patient was reluctant to undergo cardiac catheterization away from home and was discharged home.  The patient was seen in followup at which time he was clinically stable.  After a lengthy discussion, it was felt to be reasonable to consider [facility] EP evaluation for possible catheter ablation of atrial fibrillation with a rapid ventricular rate. During the interim, the patient developed recurrent atrial fibrillation and presented to [facility] Room where he ruled out for myocardial infarction by CPK isoenzymes and troponin. The patient was seen by Dr. Harold HedgeKenneth Fath in consultation. Echocardiogram revealed mildly reduced left ventricular function. Cardizem was added to his current regimen, which appeared to control his ventricular rate. The patient has CHADS2 score of 1 and was aspirin. Chronic anticoagulation was deferred in light of possible catheter ablation in the near future. Since echocardiogram revealed mildly reduced left ventricular function and the patient had recent hospitalization for elevated troponin, it has seemed reasonable to consider cardiac catheterization to definitely rule out underlying coronary artery disease. On 10/02/2012, I discussed with Drew Walker in person about cardiac catheterization. He appeared to be in agreement to proceed on 10/03/2012.  However, the daughter, who was not present during initial conversation, was very upset, called the telemetry floor and expressed her displeasure with 2 nurses about cardiac  catheterization. She also threatened to call her lawyer and pursue a lawsuit against me for proceeding with cardiac catheterization. She then called the [facility], talked to the receptionist and nurse, and again threatened to pursue legal action against me for suggesting cardiac catheterization. Later the same day, I returned to talk to the patient who again was compliant with my suggestions. However, at this point, I was concerned about the behavior of his daughter. On the morning of 10/03/2012, the patient and his wife were in their room. We discussed what had transpired between his daughter, the nurses on the floor and the clinic, and we agreed to pursue Lexiscan sestamibi study as a compromise to see if the patient has significant myocardial ischemia to further reinforce the decision to pursue cardiac catheterization. The patient underwent Lexiscan sestamibi study on 10/03/2012.  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 I returned to the room to discuss the results. At that time, the wife expressed her displeasure with my care for the patient. She stated that her daughter never threatened the nurses on the floor or the receptionist and nurse at the clinic and that she had already arranged a new physician/cardiologist to take care of Drew Walker. I agree that that would probably be the best course of action considering their lack of confidence in my care of Drew Walker. Throughout all of the discussion, the patient himself never expressed dissatisfaction of my care. We shook hands and I wished him the best. ____________________________ Marcina MillardAlexander Hikaru Delorenzo, MD ap:sb D: 10/03/2012 13:51:57 ET T: 10/03/2012 14:23:11 ET JOB#: [id number]  cc: Marcina MillardAlexander Kohner Orlick, MD, <Dictator> Marcina MillardALEXANDER Renaye Janicki MD ELECTRONICALLY SIGNED 10/16/2012 14:59

## 2014-12-12 NOTE — Op Note (Signed)
PATIENT NAME:  Drew Walker, Drew Walker MR#:  [id number] DATE OF BIRTH:  [birth year]  DATE OF PROCEDURE:  06/24/2013  PREOPERATIVE DIAGNOSIS: Bladder cancer.   POSTOPERATIVE DIAGNOSIS: Bladder cancer.    PROCEDURES: 1. Transurethral resection of bladder tumor.  2. Instillation of mitomycin chemotherapy into the bladder.   SURGEON: Anola GurneyMichael , M.D.  ANESTHETIST: Rice and Evelene Croon.   ANESTHETIC METHOD: General per Rice and local per  Evelene Croon.   INDICATIONS: See the dictated history and physical. After informed consent, the patient requests the above procedures.   OPERATIVE SUMMARY: After adequate general anesthesia had been obtained, the patient was placed into dorsal lithotomy position and the perineum was prepped and draped in the usual fashion. The 24 French resectoscope sheath was then advanced into the bladder with the obturator in place. The resectoscope was then coupled with the camera and then placed into the sheath. The bladder was thoroughly inspected. Both ureteral orifices were identified and had clear efflux. The patient was noted to have a 10 x 10 mm papillary bladder tumor just medial to the right orifice. No other lesions were identified. The bladder was moderately trabeculated. At this point the tumor was resected and fragments were submitted to pathology. Two deep fragments involving bladder muscle were also submitted. Bleeders were controlled with electrocautery. Bladder was then drained and the scope was removed. Ten milliliters of viscous Xylocaine was instilled within the urethra. A 20 French Foley catheter was placed. The catheter was irrigated until clear. Forty milligrams of mitomycin was then instilled into the bladder and the catheter was plugged. An B and O suppository was placed. The procedure was then terminated and the patient was transferred to the recovery room in stable condition.   ____________________________ [name]. Evelene Croon, MD mrw:sg D: 06/24/2013 10:14:28  ET T: 06/24/2013 11:20:23 ET JOB#: [id number]  cc: [name]. Evelene Croon, MD, <Dictator> Orson ApeMICHAEL R  MD ELECTRONICALLY SIGNED 06/24/2013 12:54

## 2014-12-14 NOTE — Consult Note (Signed)
Brief Consult Note: Diagnosis: known paroxismal afib with acute uti and afib with rapid rate now better controlled.   Comments: tx of uti b-blocker and dilt for heart rate control no further cardiac diagnostics at this time consider anticoagulation if needed.  Electronic Signatures: Lamar BlinksKowalski, Nykira Reddix J (MD)  (Signed 07-Mar-13 22:08)  Authored: Brief Consult Note   Last Updated: 07-Mar-13 22:08 by Lamar BlinksKowalski, Shyheim Tanney J (MD)

## 2014-12-14 NOTE — H&P (Signed)
PATIENT NAME:  Drew Walker, Drew R MR#:  [id number] DATE OF BIRTH:  31-Jan-1946  DATE OF ADMISSION:  10/27/2011  PRIMARY CARE PHYSICIAN: Dr. [name].   CHIEF COMPLAINT: Dysuria and fever.   HISTORY OF PRESENT ILLNESS: The patient is a 69 year old male who [facility] due to having chills and fever and also having painful urination. The patient had a recent cystoscopy done about two weeks ago by Dr. [name] for an enlarged prostate. 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 He took some Tylenol for his fever, but it did not alleviate his symptoms; therefore, [facility]. [facility], the patient was noted to have a fever of 100.4; and they did some routine blood work which showed a white cell count of 25,000 and also he was noted to have abnormal urinalysis. The patient was then referred to the hospital for further evaluation and admission. On vitals, the patient was also noted to have an irregular heartbeat, noted to be atrial fibrillation/flutter.   REVIEW OF SYSTEMS: CONSTITUTIONAL: No documented fever presently. No weight gain, no weight loss. EYES: No blurred or double vision. ENT: No tinnitus or postnasal drip. No redness of the oropharynx. RESPIRATORY: No cough, no wheeze, no hemoptysis. CARDIOVASCULAR: No chest pain, no orthopnea, no palpitations, no syncope. GI: No nausea, no vomiting, no diarrhea, no abdominal pain, no melena or hematochezia. GU: Positive dysuria, no hematuria. ENDOCRINE: No polyuria or nocturia. No heat or cold intolerance. HEMATOLOGIC: No anemia. No bruising. No bleeding. INTEGUMENTARY: No rashes. No lesions. MUSCULOSKELETAL: No arthritis, no swelling, and no gout. NEUROLOGIC: No numbness, no tingling, no ataxia, no seizure-type activity. PSYCHIATRIC: No anxiety, no insomnia, no ADD.   PAST MEDICAL HISTORY:  1. Hypertension. 2. Paroxysmal atrial  fibrillation. 3. Benign prostatic hypertrophy.   ALLERGIES: No known drug allergies.   SOCIAL HISTORY: He does smoke about a pack to a pack and a half per day for many years. No alcohol abuse. No illicit drug abuse. He lives at home with his wife.   FAMILY HISTORY: Mother died from complications of colon cancer. Father had also a history of diabetes, so did his mother. Father also died from malignancy.   CURRENT MEDICATIONS:  1. Avodart 0.5 mg daily.  2. HCTZ 25 mg daily.  3. Aspirin 81 mg daily.   PHYSICAL EXAMINATION:  VITAL SIGNS: Presently, temperature is 98.4, pulse 107, respirations 18, blood pressure 96/63, saturations 98% on room air.   GENERAL: He is a pleasant-appearing male in no apparent distress.   HEENT: He is atraumatic, normocephalic. His extraocular muscles are intact. His pupils are equal and reactive to light. His sclerae are anicteric. No conjunctival injection. No oropharyngeal erythema.   NECK: Supple. There is no jugular venous distention. No bruits, no lymphadenopathy or thyromegaly.   HEART: Irregular, no murmurs, no rubs, no clicks.   LUNGS: Clear to auscultation bilaterally. No rales, no rhonchi, no wheezes.   ABDOMEN: Soft, flat, nontender, nondistended, has good bowel sounds. No hepatosplenomegaly appreciated.   EXTREMITIES: There is no evidence of any cyanosis, clubbing, or peripheral edema. Has +2 pedal and radial pulses bilaterally.   NEUROLOGICAL: The patient is alert, awake, and oriented x3 with no focal motor or sensory deficits appreciated bilaterally.   SKIN: Moist and warm with no rash appreciated.   LYMPHATIC: There is no cervical or axillary lymphadenopathy.   LABORATORY, DIAGNOSTIC AND  RADIOLOGICAL DATA: These laboratory exams were done at the [facility].  CBC showed a white cell count of 25,000, hemoglobin 12, hematocrit 36.6, platelet count of 249.  Urinalysis showed positive nitrites, trace leukocyte esterase, 30 to 35 white  cells with many bacteria.  The patient also had a metabolic profile which showed a BUN of 17, creatinine 1.5, sodium of 136, potassium 3.1, chloride 100, bicarbonate 26.   ASSESSMENT AND PLAN: The patient is a 69 year old male with a history of hypertension, paroxysmal atrial fibrillation, history of benign prostatic hypertrophy with recent cystoscopy, presents to the [facility] with fever, chills and dysuria, noted to have urinary tract infection with fever and systemic inflammatory response syndrome, also noted to be in rapid atrial fibrillation.   1. Systemic inflammatory response syndrome: This is likely secondary to the urinary tract infection. The patient is febrile with tachycardia, irregular heartbeat and atrial fibrillation. The patient has received one dose of IM Rocephin at the [facility]. I will also continue IV Rocephin starting tomorrow, follow his cultures, continue IV fluids and follow hemodynamics.  2. Urinary tract infection: As mentioned, the patient did receive some IM Rocephin at the [facility]. I will continue IV Rocephin, follow his cultures.  3. Acute renal failure: Likely in the setting of dehydration and urinary tract infection. I will aggressively hydrate with IV fluids. Continue IV antibiotics. Follow his BUN and creatinine, and his urine output, renal dose his medications, avoid nephrotoxins, and hold hydrochlorothiazide for now.  4. History of benign prostatic hypertrophy: No evidence of any urinary obstruction. Continue Avodart. I will place him on some Pyridium for his dysuria.  5. Paroxysmal atrial fibrillation: The patient has had this in the past. His rates are currently uncontrolled due to infection and systemic inflammatory response syndrome. He was not on any rate-controlling medications prior to coming in. He used to be on Toprol but stopped it a few months ago. I will start him on a low-dose beta blocker, Toprol, also give him a pulse dose of IV Cardizem  and place him on Cardizem every 6 hours. I will check a two-dimensional echocardiogram. I will cycle his cardiac markers, place him on telemetry, and get a Cardiology consult.  6. Leukocytosis secondary to the infection and SIRS: I will follow his white cell count with treatment of IV antibiotics.  CODE STATUS:  The patient is a FULL CODE.     TIME SPENT WITH ADMISSION:  50 minutes.   ____________________________ Rolly Pancake. Cherlynn Kaiser, MD vjs:cbb D: 10/27/2011 15:53:00 ET T: 10/27/2011 16:41:19 ET JOB#: [id number]  cc: Rolly Pancake. Cherlynn Kaiser, MD, <Dictator> Durward Mallard. Marguerite Olea, MD Houston Siren MD ELECTRONICALLY SIGNED 10/27/2011 18:33

## 2014-12-14 NOTE — Discharge Summary (Signed)
PATIENT NAME:  Drew Walker, Drew Walker MR#:  [id number] DATE OF BIRTH:  [birth year]  DATE OF ADMISSION:  10/27/2011 DATE OF DISCHARGE:  10/29/2011  PRESENTING COMPLAINT: Dysuria and fever.   DISCHARGE DIAGNOSES:  1. Systemic inflammatory response syndrome due to Escherichia coli urinary tract infection.  2. Leukocytosis.  3. Acute renal failure.  4. Paroxysmal atrial fibrillation,.  CONDITION ON DISCHARGE: Fair. Vitals stable.   MEDICATIONS: 1. Avodart 0.5 mg p.o. daily.  2. Aspirin 81 mg 2 tablets daily.  3. Rapaflo 8 mg daily.  4. Keflex 500 p.o. t.i.d. for seven days.  5. Cardizem CD 120 mg daily.  6. Patient advised not to take hydrochlorothiazide.   FOLLOW UP: Follow up with Dr. [name] in one week, Dr. Marguerite OleaMoffett in 1 to 2 weeks.   LABORATORY, DIAGNOSTIC AND RADIOLOGICAL DATA: Glucose 103, BUN 11, creatinine 1.1, sodium 134, bicarbonate 20, calcium 8, white count 13.2. Troponin is less than 0.2. Echo showed low normal LVF, left ventricular hypertrophy, mild to moderate MR and TR. White count on admission 18,000. Urine culture grew 20,000 colonies of Escherichia coli sensitive to cephalosporins.   Urinalysis positive for urinary tract infection.   CONSULTATION: Cardiology consultation with Dr. Gwen PoundsKowalski.    BRIEF SUMMARY OF HOSPITAL COURSE: [name] is a pleasant 69 year old gentleman with history of hypertension, paroxysmal atrial fibrillation came in with:  1. Systemic inflammatory response syndrome due to urinary tract infection. Patient recently had cystoscopy done by Dr. [name] for benign prostatic hypertrophy symptoms. Presented with fever, chills and dysuria. He was found to have urinary tract infection, started on IV Rocephin. His Escherichia coli was sensitive to cephalosporin hence changed to p.o. Keflex for total of 10 days.  2. Urinary tract infection, on cephalosporins. Patient will complete a 10 day course as outpatient. Blood cultures remained negative.  3. Acute renal  failure due to dehydration, resolved.  4. Paroxysmal atrial fibrillation, rate uncontrolled due to systemic inflammatory response syndrome and urinary tract infection. Started on Cardizem CD which patient tolerated well. His hydrochlorothiazide was discontinued. He was seen by Dr. Gwen PoundsKowalski. Did not recommend any anticoagulation at present. Patient will continue aspirin.  5. History of benign prostatic hypertrophy. Continue Avodart. Follow up with Dr. [name] as outpatient.  6. Hospital stay otherwise remained stable. CODE STATUS: Patient remained a FULL CODE.   TIME SPENT: 40 minutes.  ____________________________ Wylie HailSona A. Allena KatzPatel, MD sap:cms D: 10/30/2011 12:09:43 ET T: 10/30/2011 12:20:45 ET JOB#: [id number]  cc: Lewi Drost A. Allena KatzPatel, MD, <Dictator> Durward MallardJoel B. Marguerite OleaMoffett, MD Lamar BlinksBruce J. Kowalski, MD [name]. [name], MD Willow OraSONA A Arianis Bowditch MD ELECTRONICALLY SIGNED 11/03/2011 16:04

## 2014-12-14 NOTE — Consult Note (Signed)
PATIENT NAME:  Drew Walker, Drew R MR#:  [id number] DATE OF BIRTH:  05/31/1946  DATE OF CONSULTATION:  10/27/2011  REFERRING PHYSICIAN:  Hilda LiasVivek Sainani, MD CONSULTING PHYSICIAN:  Lamar BlinksBruce J. , MD  REASON FOR CONSULTATION: Atrial fibrillation with rapid ventricular rate with acute renal failure.   CHIEF COMPLAINT: "I have fever."   HISTORY OF PRESENT ILLNESS: This is a 69 year old male who went [facility] clinic [facility] due to having chills and fever and having painful urination. The patient had a recent cystoscopy for which he has had some treatment and it appears that he has had a urinary tract infection with a white count of 25,000. The patient has had some abdominal pain from this urination issue as well. He does have hypertension previously well controlled on appropriate medications. He has also had atrial fibrillation in the past and now has an EKG showing atrial fibrillation with rapid ventricular rate. He has some valvular heart disease and shortness of breath.   REVIEW OF SYSTEMS: The remainder of review of systems is negative for vision change, ringing in the ears, hearing loss, cough, congestion, heartburn, nausea, vomiting, diarrhea, bloody stools, stomach pain, extremity pain, leg weakness, cramping of the buttocks, blood clots, weakness, vision change, skin rash, depression, and anxiety.  PAST MEDICAL HISTORY:  1. Atrial fibrillation.  2. Hypertension.  3. Benign prostatic hypertrophy.   FAMILY HISTORY: Mother died of complications of colon cancer. Father also had diabetes.   SOCIAL HISTORY: He does smoke about 1-1/2 packs a day for many years. He denies alcohol use.   ALLERGIES: He has no known drug allergies.   CURRENT MEDICATIONS: As listed.   PHYSICAL EXAMINATION:   VITAL SIGNS: Blood pressure 96/60 bilaterally and heart rate 72 upright, reclining, and regular.   GENERAL: He is a well appearing male in no acute distress.   HEENT: No icterus,  thyromegaly, ulcers, hemorrhage, or xanthelasma.   HEART: Irregular, irregular with normal S1 and S2 without murmur, gallop, or rub. Point of maximal impulse is normal size and placement. Carotid upstroke is normal without bruit. Jugular venous pressure is normal   LUNGS: Few basilar crackles with normal respirations.   ABDOMEN: Soft and nontender without hepatosplenomegaly or masses. Abdominal aorta is normal size without bruit.   EXTREMITIES: 2+ bilateral pulses in dorsal, pedal, radial, and femoral arteries without lower extremity edema, cyanosis, clubbing, or ulcers.   NEUROLOGIC: He is oriented to time, place, and person with normal mood and affect.   ASSESSMENT: This is a 69 year old male with hypertension, atrial fibrillation, and valvular heart disease with atrial fibrillation with rapid ventricular rate.   RECOMMENDATIONS:  1. Continue treatment of urinary tract infection, which is likely causing current issues of atrial fibrillation.  2. Beta blocker and/or calcium channel blocker for heart rate control and possible spontaneous conversion to normal rhythm.  3. No use of anticoagulation at this point due to concerns of bleeding complications with urinary tract infection and possible hematuria.       4. Further diagnostic testing including echocardiogram.  5. Further treatment options after above.  ____________________________ Lamar BlinksBruce J. , MD bjk:slb D: 11/05/2011 08:31:46 ET T: 11/05/2011 11:37:32 ET JOB#: [id number]  cc: Lamar BlinksBruce J. , MD, <Dictator> Lamar BlinksBRUCE J  MD ELECTRONICALLY SIGNED 11/08/2011 14:03

## 2015-10-19 IMAGING — CT CT ABDOMEN AND PELVIS WITHOUT AND WITH CONTRAST
3 of 10 series · 12 of 46 positions shown, 18 images · IV contrast (isovue)
Comparison: 12/28/2004

CLINICAL DATA: Hematuria.  Previous removal of bladder polyps.

EXAM:
CT ABDOMEN AND PELVIS WITHOUT AND WITH CONTRAST
TECHNIQUE: Multidetector CT imaging of the abdomen and pelvis was performed
without contrast material in one or both body regions, followed by
contrast material(s) and further sections in one or both body
regions.
CONTRAST:  125 mL Isovue 370

[Series 2: hematuria > 45 wo · axial · 0.98mm/px · z∈[-623,-268]mm · 8 of 93 slices shown, 13 images]
[im 11/93  soft-tissue]
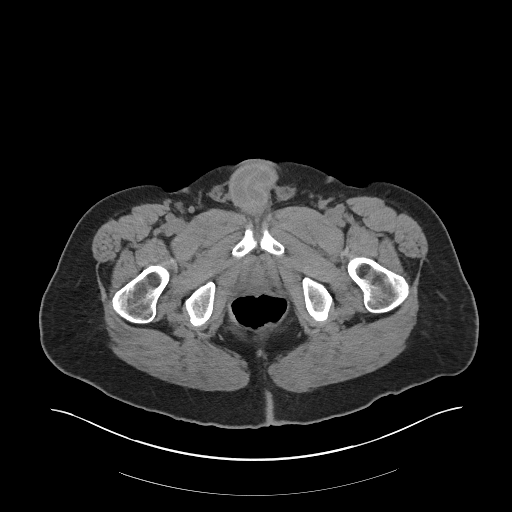
[im 11/93  bone]
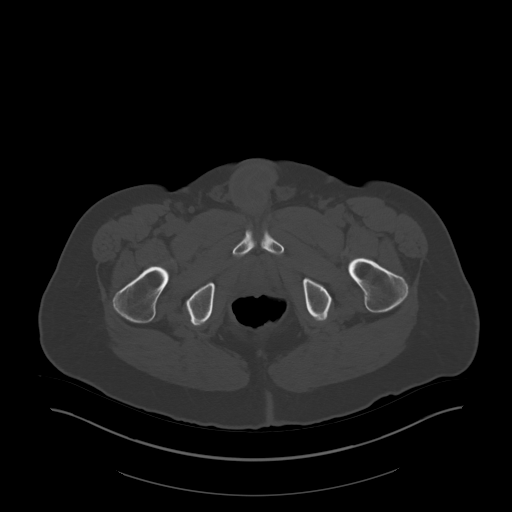
[im 21/93  soft-tissue]
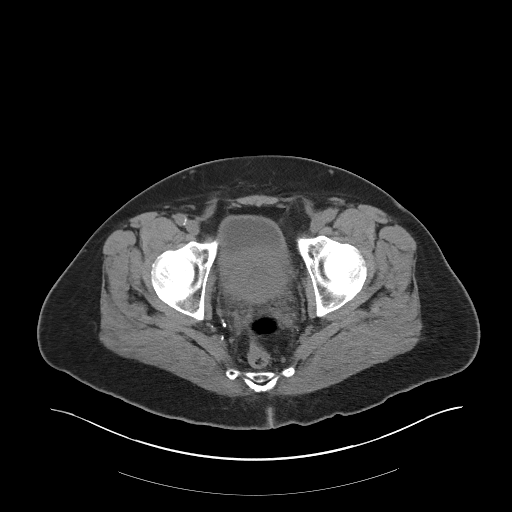
[im 31/93  soft-tissue]
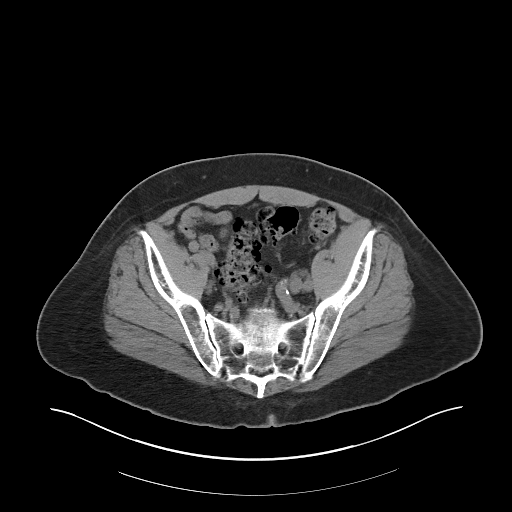
[im 41/93  soft-tissue]
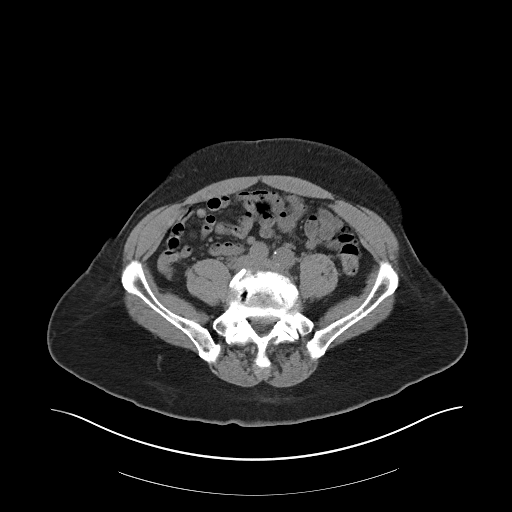
[im 52/93  soft-tissue]
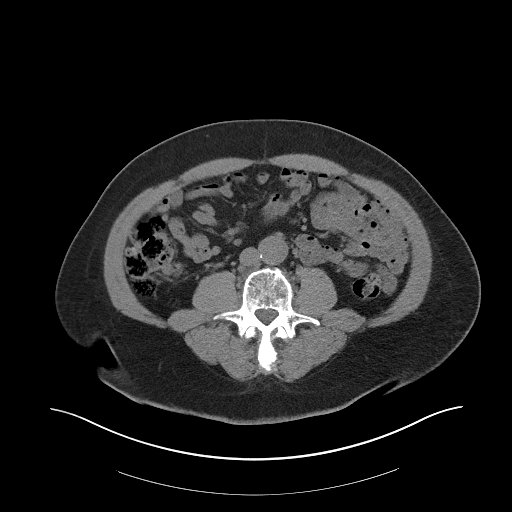
[im 52/93  lung]
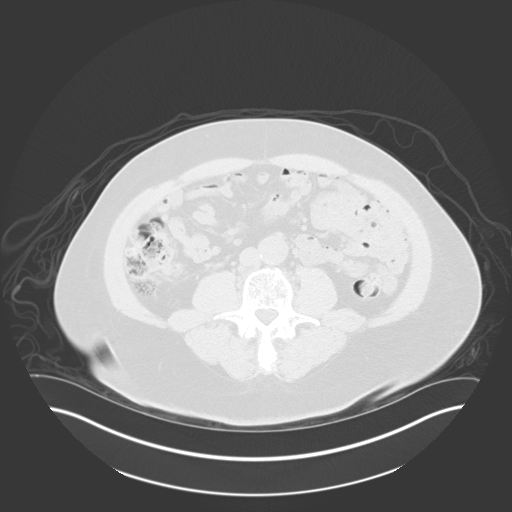
[im 62/93  soft-tissue]
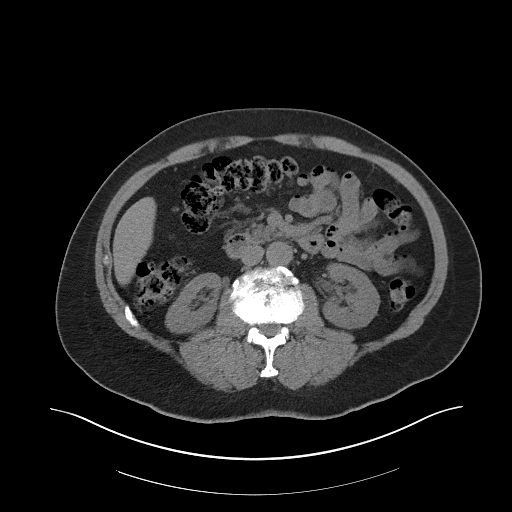
[im 62/93  lung]
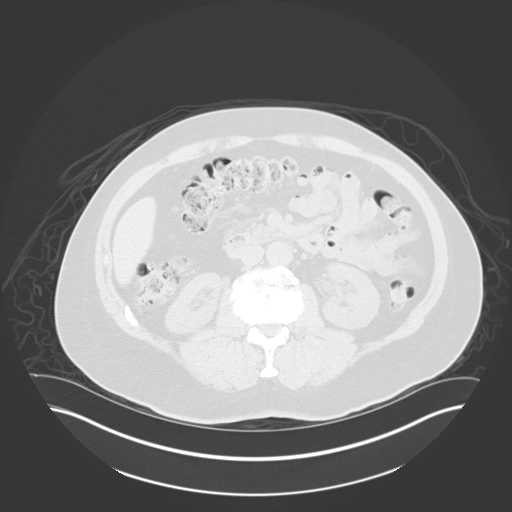
[im 72/93  soft-tissue]
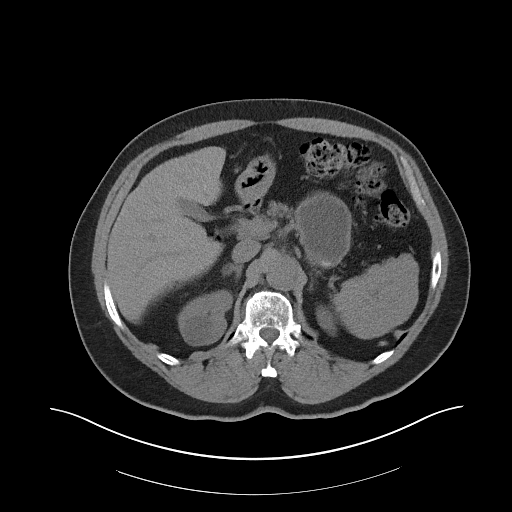
[im 72/93  lung]
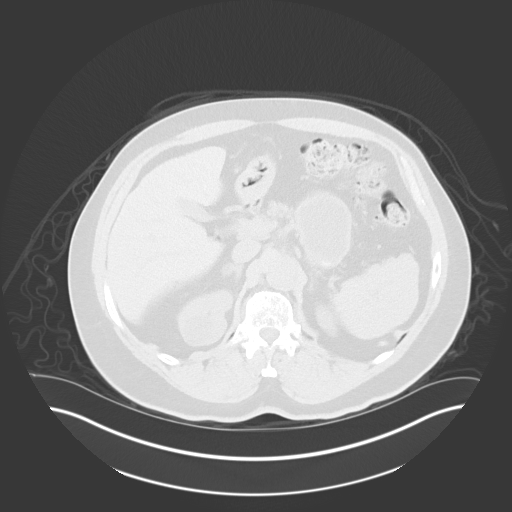
[im 82/93  soft-tissue]
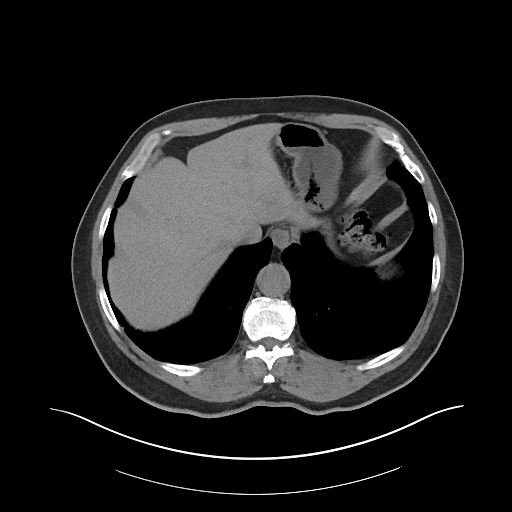
[im 82/93  lung]
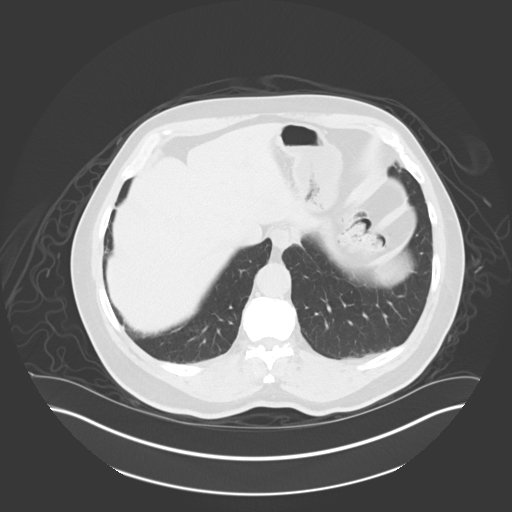

[Series 5: cor hematuria > 45 wo · coronal · 0.88mm/px · 2 of 150 slices shown, 3 images]
[im 50/150  soft-tissue]
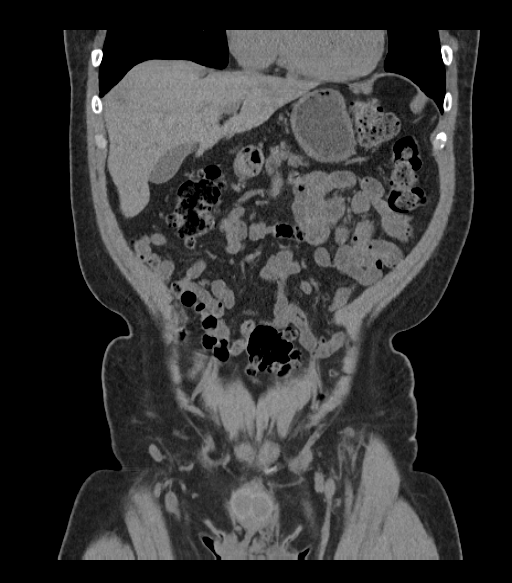
[im 50/150  bone]
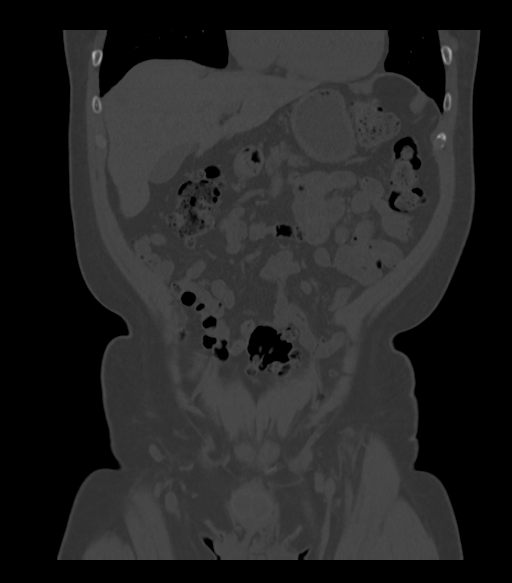
[im 100/150  soft-tissue]
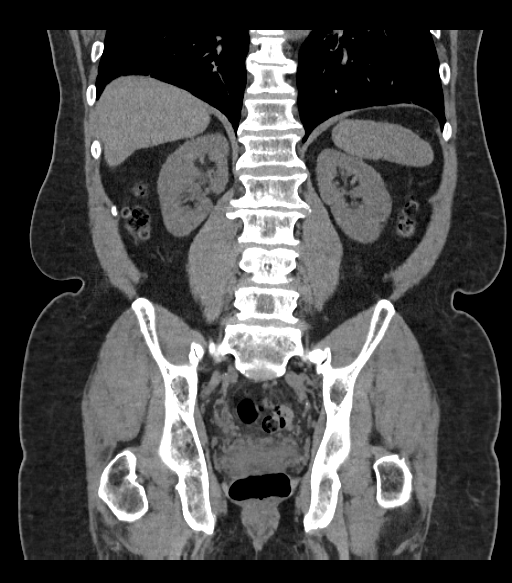

[Series 7: hematuria > 45 with · axial · 0.98mm/px · z∈[-642,-587]mm · 2 of 97 slices shown]
[im 11/97  soft-tissue]
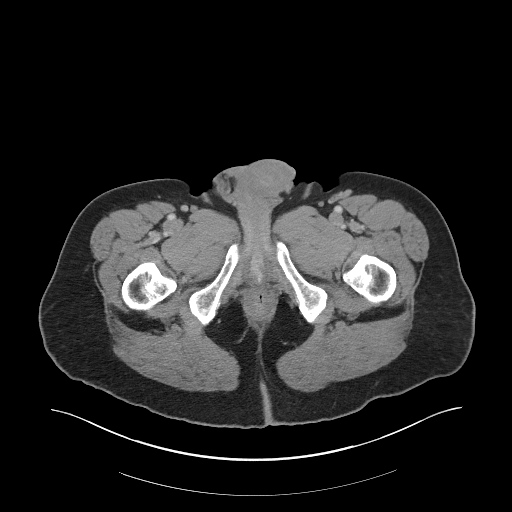
[im 22/97  soft-tissue]
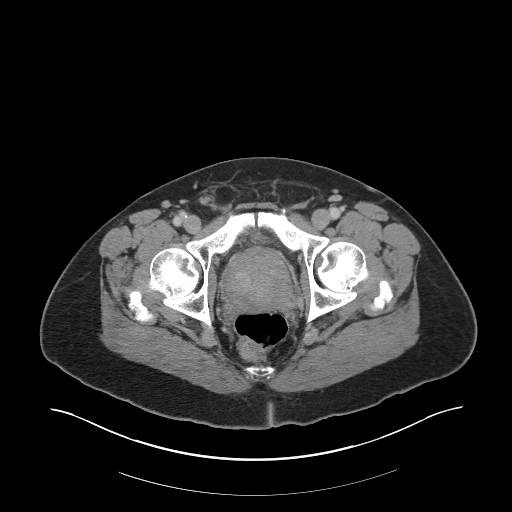

[12 of 46 positions shown; findings below may reference images not displayed]

FINDINGS: Tiny 2-3 mm nonobstructing calculus noted in upper pole of the left
kidney. No other renal calculi identified. No evidence of
hydronephrosis. No evidence of ureteral calculi or dilatation. No
bladder calculi identified.

Post-contrast phases show a 3 cm simple cyst in the upper pole of
the right kidney. No complex cystic or solid masses are seen
involving either kidney. No masses are seen involving the renal
collecting systems opacified portions of ureters, or urinary
bladder. Moderately enlarged prostate gland is seen with mass effect
on bladder base. Seminal vesicles are symmetric.

Small left hepatic lobe cyst is noted but no liver masses are
identified. Cholelithiasis is demonstrated, however there is no
evidence of cholecystitis or biliary dilatation. The pancreas,
spleen, and adrenal glands are normal in appearance.

No other soft tissue masses or lymphadenopathy identified within the
abdomen or pelvis. Normal appendix is visualized. No evidence of
inflammatory process or abnormal fluid collections. Severe
diverticulosis is seen involving the sigmoid colon, however there is
no evidence of diverticulitis. No evidence of dilated bowel loops.
Atherosclerotic plaque noted, however no evidence of abdominal
aortic aneurysm. No suspicious bone lesions identified.
IMPRESSION: Tiny less than 5 mm nonobstructing left upper pole renal calculus.
No evidence of ureteral calculi or hydronephrosis.

No evidence of urinary tract neoplasm.

Moderately enlarged prostate.

Diverticulosis. No radiographic evidence of diverticulitis.

Cholelithiasis. No radiographic evidence of cholecystitis.

## 2016-08-23 ENCOUNTER — Ambulatory Visit (INDEPENDENT_AMBULATORY_CARE_PROVIDER_SITE_OTHER): Payer: Medicare Other | Admitting: Podiatry

## 2016-08-23 ENCOUNTER — Ambulatory Visit (INDEPENDENT_AMBULATORY_CARE_PROVIDER_SITE_OTHER): Payer: Medicare Other

## 2016-08-23 ENCOUNTER — Ambulatory Visit: Payer: Medicare Other

## 2016-08-23 ENCOUNTER — Encounter: Payer: Self-pay | Admitting: Podiatry

## 2016-08-23 VITALS — BP 114/83 | HR 103 | Temp 97.3°F | Resp 16

## 2016-08-23 DIAGNOSIS — M109 Gout, unspecified: Secondary | ICD-10-CM | POA: Diagnosis not present

## 2016-08-23 DIAGNOSIS — M10072 Idiopathic gout, left ankle and foot: Secondary | ICD-10-CM

## 2016-08-23 DIAGNOSIS — M2010 Hallux valgus (acquired), unspecified foot: Secondary | ICD-10-CM

## 2016-08-23 DIAGNOSIS — R52 Pain, unspecified: Secondary | ICD-10-CM

## 2016-08-23 DIAGNOSIS — M2042 Other hammer toe(s) (acquired), left foot: Secondary | ICD-10-CM

## 2016-08-23 DIAGNOSIS — M21619 Bunion of unspecified foot: Secondary | ICD-10-CM

## 2016-08-23 DIAGNOSIS — M2041 Other hammer toe(s) (acquired), right foot: Secondary | ICD-10-CM

## 2016-08-23 MED ORDER — BETAMETHASONE SOD PHOS & ACET 6 (3-3) MG/ML IJ SUSP: 3.0000 mg | Freq: Once | INTRAMUSCULAR | Status: AC

## 2016-08-23 NOTE — Progress Notes (Signed)
   Subjective:    Patient ID: Drew Walker, male    DOB: May 27, 1946, 71 y.o.   MRN: [id number]  HPI    Review of Systems  All other systems reviewed and are negative.      Objective:   Physical Exam        Assessment & Plan:

## 2016-08-23 NOTE — Progress Notes (Signed)
Patient ID: Drew HeldJerry R Ono, male   DOB: 04/05/1946, 71 y.o.   MRN: [id number] Subjective:  Patient presents today as a new patient for evaluation of great toe pain 5 weeks to the left lower extremity. Patient states that his primary care physician treated him for gout. Patient is not diabetic. PCP prescribed prednisolone to alleviate symptoms. Patient states he still has residual pain.    Objective/Physical Exam General: The patient is alert and oriented x3 in no acute distress.  Dermatology: Skin is warm, dry and supple bilateral lower extremities. Negative for open lesions or macerations.  Vascular: Palpable pedal pulses bilaterally. No edema or erythema noted. Capillary refill within normal limits.  Neurological: Epicritic and protective threshold grossly intact bilaterally.   Musculoskeletal Exam: Pain on palpation and range of motion of the first MPJ left foot. Mild edema noted. Range of motion within normal limits to all pedal and ankle joints bilateral. Muscle strength 5/5 in all groups bilateral.  Hallux abductovalgus deformity noted with large prominent bunions bilateral. Hammertoe deformity also noted second digit bilateral  Radiographic Exam:  Joint space narrowing with degenerative joint disease noted to the first MPJ left foot. Increased intermetatarsal angle noted greater than 15. Hallux abductus angle noted greater than 30.  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 #2 injection of 0.5 mL Celestone Soluspan injected in the first MPJ left foot. #3 orders for uric acid levels placed #4 today we did discuss the conservative versus surgical management of bunion deformity. #5 return to clinic in 4 weeks for follow-up evaluation   Felecia ShellingBrent M. Rande Dario, DPM [facility]  Dr. Felecia ShellingBrent M. Acelyn Basham, DPM     [address]                                        [place], [place] [address]                Office [phone]  Fax [phone]

## 2016-09-20 ENCOUNTER — Ambulatory Visit: Payer: Medicare Other | Admitting: Podiatry

## 2016-10-04 ENCOUNTER — Ambulatory Visit: Payer: Medicare Other | Admitting: Podiatry

## 2017-02-24 MED ORDER — VALSARTAN 160 MG TABLET
ORAL_TABLET | Freq: Every day | ORAL | 3 refills | 0.00000 days | Status: CP
Start: 2017-02-24 — End: 2017-08-10

## 2017-03-24 ENCOUNTER — Ambulatory Visit
Admission: RE | Admit: 2017-03-24 | Discharge: 2017-03-24 | Disposition: A | Discharge status: Home / Self Care | Payer: MEDICARE | Attending: Family Medicine | Admitting: Family Medicine

## 2017-03-24 ENCOUNTER — Ambulatory Visit
Admission: RE | Admit: 2017-03-24 | Discharge: 2017-03-24 | Disposition: A | Discharge status: Home / Self Care | Payer: MEDICARE

## 2017-03-24 DIAGNOSIS — R0600 Dyspnea, unspecified: Secondary | ICD-10-CM

## 2017-03-24 DIAGNOSIS — J189 Pneumonia, unspecified organism: Principal | ICD-10-CM

## 2017-03-24 MED ORDER — MOXIFLOXACIN 400 MG TABLET
ORAL_TABLET | Freq: Every day | ORAL | 0 refills | 0.00000 days | Status: CP
Start: 2017-03-24 — End: 2017-04-03

## 2017-03-29 ENCOUNTER — Ambulatory Visit: Admission: RE | Admit: 2017-03-29 | Discharge: 2017-03-29 | Payer: MEDICARE | Attending: Family Medicine

## 2017-03-29 DIAGNOSIS — J189 Pneumonia, unspecified organism: Principal | ICD-10-CM

## 2017-04-13 ENCOUNTER — Ambulatory Visit
Admission: RE | Admit: 2017-04-13 | Discharge: 2017-04-13 | Disposition: A | Discharge status: Home / Self Care | Payer: MEDICARE | Admitting: Urology

## 2017-04-13 DIAGNOSIS — N390 Urinary tract infection, site not specified: Principal | ICD-10-CM

## 2017-04-13 DIAGNOSIS — C679 Malignant neoplasm of bladder, unspecified: Secondary | ICD-10-CM

## 2017-04-25 ENCOUNTER — Ambulatory Visit
Admission: RE | Admit: 2017-04-25 | Discharge: 2017-04-25 | Disposition: A | Discharge status: Home / Self Care | Payer: MEDICARE

## 2017-04-25 DIAGNOSIS — C679 Malignant neoplasm of bladder, unspecified: Principal | ICD-10-CM

## 2017-05-23 ENCOUNTER — Ambulatory Visit
Admission: RE | Admit: 2017-05-23 | Discharge: 2017-05-23 | Disposition: A | Discharge status: Home / Self Care | Payer: MEDICARE

## 2017-05-23 DIAGNOSIS — R0781 Pleurodynia: Principal | ICD-10-CM

## 2017-05-23 DIAGNOSIS — R079 Chest pain, unspecified: Principal | ICD-10-CM

## 2017-05-23 MED ORDER — LIDOCAINE 5 % TOPICAL PATCH
MEDICATED_PATCH | Freq: Two times a day (BID) | TRANSDERMAL | 0 refills | 0 days | Status: CP
Start: 2017-05-23 — End: 2017-11-07

## 2017-06-08 MED ORDER — TIOTROPIUM BROMIDE 18 MCG CAPSULE WITH INHALATION DEVICE
Freq: Every day | RESPIRATORY_TRACT | 3 refills | 0 days | Status: SS
Start: 2017-06-08 — End: 2017-11-11

## 2017-06-13 ENCOUNTER — Ambulatory Visit
Admission: RE | Admit: 2017-06-13 | Discharge: 2017-06-13 | Disposition: A | Discharge status: Home / Self Care | Payer: MEDICARE | Attending: Cardiovascular Disease | Admitting: Cardiovascular Disease

## 2017-06-13 DIAGNOSIS — E785 Hyperlipidemia, unspecified: Principal | ICD-10-CM

## 2017-06-13 DIAGNOSIS — I240 Acute coronary thrombosis not resulting in myocardial infarction: Secondary | ICD-10-CM

## 2017-06-13 MED ORDER — ROSUVASTATIN 5 MG TABLET
ORAL_TABLET | Freq: Every day | ORAL | 3 refills | 0.00000 days | Status: CP
Start: 2017-06-13 — End: 2017-09-19

## 2017-08-10 MED ORDER — LOSARTAN 50 MG TABLET
ORAL_TABLET | Freq: Every day | ORAL | 4 refills | 0 days | Status: CP
Start: 2017-08-10 — End: 2017-11-07

## 2017-09-12 ENCOUNTER — Encounter: Admit: 2017-09-12 | Discharge: 2017-09-13 | Payer: MEDICARE | Attending: Sports Medicine | Primary: Sports Medicine

## 2017-09-12 DIAGNOSIS — R0781 Pleurodynia: Principal | ICD-10-CM

## 2017-09-12 MED ORDER — DICLOFENAC 1 % TOPICAL GEL
Freq: Four times a day (QID) | TOPICAL | 0 refills | 0.00000 days | Status: SS
Start: 2017-09-12 — End: 2017-11-09

## 2017-09-18 ENCOUNTER — Encounter: Admit: 2017-09-18 | Discharge: 2017-09-19 | Payer: MEDICARE

## 2017-09-18 DIAGNOSIS — C3412 Malignant neoplasm of upper lobe, left bronchus or lung: Principal | ICD-10-CM

## 2017-09-19 ENCOUNTER — Ambulatory Visit: Admit: 2017-09-19 | Discharge: 2017-09-19 | Payer: MEDICARE | Attending: Surgery | Primary: Surgery

## 2017-09-19 ENCOUNTER — Ambulatory Visit
Admit: 2017-09-19 | Discharge: 2017-09-19 | Payer: MEDICARE | Attending: Cardiovascular Disease | Primary: Cardiovascular Disease

## 2017-09-19 DIAGNOSIS — R222 Localized swelling, mass and lump, trunk: Principal | ICD-10-CM

## 2017-09-19 DIAGNOSIS — I10 Essential (primary) hypertension: Secondary | ICD-10-CM

## 2017-09-19 DIAGNOSIS — E785 Hyperlipidemia, unspecified: Principal | ICD-10-CM

## 2017-09-19 MED ORDER — EZETIMIBE 10 MG TABLET
ORAL_TABLET | Freq: Every day | ORAL | 3 refills | 0.00000 days | Status: CP
Start: 2017-09-19 — End: 2018-09-19

## 2017-10-03 ENCOUNTER — Ambulatory Visit: Admit: 2017-10-03 | Discharge: 2017-10-04 | Payer: MEDICARE

## 2017-10-03 ENCOUNTER — Encounter: Admit: 2017-10-03 | Discharge: 2017-10-04 | Payer: MEDICARE

## 2017-10-03 ENCOUNTER — Encounter: Admit: 2017-10-03 | Discharge: 2017-10-04 | Payer: MEDICARE | Attending: Surgery | Primary: Surgery

## 2017-10-03 DIAGNOSIS — R590 Localized enlarged lymph nodes: Secondary | ICD-10-CM

## 2017-10-03 DIAGNOSIS — R9389 Abnormal findings on diagnostic imaging of other specified body structures: Secondary | ICD-10-CM

## 2017-10-03 DIAGNOSIS — R222 Localized swelling, mass and lump, trunk: Principal | ICD-10-CM

## 2017-10-03 DIAGNOSIS — C3490 Malignant neoplasm of unspecified part of unspecified bronchus or lung: Principal | ICD-10-CM

## 2017-10-17 ENCOUNTER — Encounter: Admit: 2017-10-17 | Discharge: 2017-10-18 | Payer: MEDICARE | Attending: Surgery | Primary: Surgery

## 2017-10-17 DIAGNOSIS — R222 Localized swelling, mass and lump, trunk: Principal | ICD-10-CM

## 2017-11-07 DIAGNOSIS — R222 Localized swelling, mass and lump, trunk: Principal | ICD-10-CM

## 2017-11-07 DIAGNOSIS — I2510 Atherosclerotic heart disease of native coronary artery without angina pectoris: Principal | ICD-10-CM

## 2017-11-07 DIAGNOSIS — C3490 Malignant neoplasm of unspecified part of unspecified bronchus or lung: Principal | ICD-10-CM

## 2017-11-07 DIAGNOSIS — I10 Essential (primary) hypertension: Secondary | ICD-10-CM

## 2017-11-07 MED ORDER — LOSARTAN 50 MG TABLET
ORAL_TABLET | Freq: Every day | ORAL | 4 refills | 0.00000 days | Status: CP
Start: 2017-11-07 — End: 2018-03-07

## 2017-11-07 MED ORDER — ROSUVASTATIN 5 MG TABLET
ORAL_TABLET | Freq: Every day | ORAL | 6 refills | 0.00000 days
Start: 2017-11-07 — End: 2018-04-18

## 2017-11-08 DIAGNOSIS — R6889 Other general symptoms and signs: Secondary | ICD-10-CM

## 2017-11-08 DIAGNOSIS — N390 Urinary tract infection, site not specified: Principal | ICD-10-CM

## 2017-11-08 DIAGNOSIS — N12 Tubulo-interstitial nephritis, not specified as acute or chronic: Principal | ICD-10-CM

## 2017-11-08 DIAGNOSIS — R509 Fever, unspecified: Secondary | ICD-10-CM

## 2017-11-08 DIAGNOSIS — Z87440 Personal history of urinary (tract) infections: Secondary | ICD-10-CM

## 2017-11-09 ENCOUNTER — Ambulatory Visit
Admit: 2017-11-09 | Discharge: 2017-11-11 | Disposition: A | Discharge status: Home / Self Care | Payer: MEDICARE | Attending: Cardiovascular Disease

## 2017-11-09 ENCOUNTER — Ambulatory Visit: Admit: 2017-11-09 | Discharge: 2017-11-11 | Disposition: A | Discharge status: Home / Self Care | Payer: MEDICARE

## 2017-11-09 ENCOUNTER — Ambulatory Visit
Admit: 2017-11-09 | Discharge: 2017-11-11 | Disposition: A | Discharge status: Home / Self Care | Payer: MEDICARE | Attending: Surgery

## 2017-11-09 DIAGNOSIS — N390 Urinary tract infection, site not specified: Principal | ICD-10-CM

## 2017-11-11 MED ORDER — CEFDINIR 300 MG CAPSULE
ORAL_CAPSULE | Freq: Two times a day (BID) | ORAL | 0 refills | 0 days | Status: CP
Start: 2017-11-11 — End: 2017-11-19

## 2017-11-11 MED ORDER — ALBUTEROL SULFATE HFA 90 MCG/ACTUATION AEROSOL INHALER
Freq: Four times a day (QID) | RESPIRATORY_TRACT | 5 refills | 0 days | Status: CP | PRN
Start: 2017-11-11 — End: 2017-12-26

## 2017-11-11 MED ORDER — METOPROLOL TARTRATE 25 MG TABLET
ORAL_TABLET | Freq: Two times a day (BID) | ORAL | 0 refills | 0 days | Status: CP
Start: 2017-11-11 — End: 2018-02-02

## 2017-11-11 MED ORDER — TIOTROPIUM BROMIDE 18 MCG CAPSULE WITH INHALATION DEVICE
Freq: Every day | RESPIRATORY_TRACT | 3 refills | 0.00000 days | Status: CP
Start: 2017-11-11 — End: 2018-03-07

## 2017-11-21 ENCOUNTER — Encounter: Admit: 2017-11-21 | Discharge: 2017-11-22 | Payer: MEDICARE

## 2017-11-21 DIAGNOSIS — I10 Essential (primary) hypertension: Secondary | ICD-10-CM

## 2017-11-21 DIAGNOSIS — J449 Chronic obstructive pulmonary disease, unspecified: Secondary | ICD-10-CM

## 2017-11-21 DIAGNOSIS — N390 Urinary tract infection, site not specified: Secondary | ICD-10-CM

## 2017-11-21 DIAGNOSIS — B9620 Unspecified Escherichia coli [E. coli] as the cause of diseases classified elsewhere: Secondary | ICD-10-CM

## 2017-11-21 DIAGNOSIS — I4819 Other persistent atrial fibrillation: Principal | ICD-10-CM

## 2017-11-21 DIAGNOSIS — R4189 Other symptoms and signs involving cognitive functions and awareness: Secondary | ICD-10-CM

## 2017-11-29 ENCOUNTER — Encounter: Admit: 2017-11-29 | Discharge: 2017-11-30 | Payer: MEDICARE

## 2017-11-29 DIAGNOSIS — N3001 Acute cystitis with hematuria: Principal | ICD-10-CM

## 2017-11-29 DIAGNOSIS — N390 Urinary tract infection, site not specified: Secondary | ICD-10-CM

## 2017-11-29 MED ORDER — NITROFURANTOIN MONOHYDRATE/MACROCRYSTALS 100 MG CAPSULE
ORAL_CAPSULE | Freq: Two times a day (BID) | ORAL | 0 refills | 0 days | Status: CP
Start: 2017-11-29 — End: 2017-12-26

## 2017-12-18 MED ORDER — ELIQUIS 5 MG TABLET
ORAL_TABLET | Freq: Two times a day (BID) | ORAL | 10 refills | 0.00000 days | Status: CP
Start: 2017-12-18 — End: 2018-03-07

## 2017-12-26 ENCOUNTER — Encounter: Admit: 2017-12-26 | Discharge: 2017-12-26 | Payer: MEDICARE

## 2017-12-26 DIAGNOSIS — R152 Fecal urgency: Secondary | ICD-10-CM

## 2017-12-26 DIAGNOSIS — N390 Urinary tract infection, site not specified: Principal | ICD-10-CM

## 2017-12-26 DIAGNOSIS — I10 Essential (primary) hypertension: Secondary | ICD-10-CM

## 2017-12-26 DIAGNOSIS — J439 Emphysema, unspecified: Secondary | ICD-10-CM

## 2017-12-26 DIAGNOSIS — R159 Full incontinence of feces: Secondary | ICD-10-CM

## 2017-12-26 MED ORDER — NITROFURANTOIN MONOHYDRATE/MACROCRYSTALS 100 MG CAPSULE: 100 mg | capsule | Freq: Every day | 0 refills | 0 days | Status: AC

## 2017-12-26 MED ORDER — ALBUTEROL SULFATE HFA 90 MCG/ACTUATION AEROSOL INHALER
Freq: Four times a day (QID) | RESPIRATORY_TRACT | 5 refills | 0 days | Status: CP | PRN
Start: 2017-12-26 — End: 2019-02-12

## 2017-12-26 MED ORDER — NITROFURANTOIN MONOHYDRATE/MACROCRYSTALS 100 MG CAPSULE
ORAL_CAPSULE | Freq: Every day | ORAL | 0 refills | 0.00000 days | Status: CP
Start: 2017-12-26 — End: 2017-12-26

## 2018-01-24 MED ORDER — DIGOXIN 125 MCG (0.125 MG) TABLET
ORAL_TABLET | 3 refills | 0 days | Status: CP
Start: 2018-01-24 — End: 2019-03-21

## 2018-01-25 ENCOUNTER — Encounter: Admit: 2018-01-25 | Discharge: 2018-01-26 | Payer: MEDICARE | Attending: Urology | Primary: Urology

## 2018-01-25 DIAGNOSIS — R399 Unspecified symptoms and signs involving the genitourinary system: Principal | ICD-10-CM

## 2018-01-25 MED ORDER — TAMSULOSIN 0.4 MG CAPSULE
ORAL_CAPSULE | Freq: Every day | ORAL | 3 refills | 0.00000 days | Status: CP
Start: 2018-01-25 — End: 2019-02-12

## 2018-02-02 MED ORDER — METOPROLOL TARTRATE 25 MG TABLET
ORAL_TABLET | Freq: Two times a day (BID) | ORAL | 0 refills | 0 days | Status: CP
Start: 2018-02-02 — End: 2018-02-05

## 2018-02-05 MED ORDER — METOPROLOL TARTRATE 25 MG TABLET
ORAL_TABLET | Freq: Two times a day (BID) | ORAL | 1 refills | 0 days | Status: CP
Start: 2018-02-05 — End: 2018-03-07

## 2018-02-06 ENCOUNTER — Ambulatory Visit
Admit: 2018-02-06 | Discharge: 2018-02-06 | Payer: MEDICARE | Attending: Nurse Practitioner | Primary: Nurse Practitioner

## 2018-02-06 ENCOUNTER — Encounter: Admit: 2018-02-06 | Discharge: 2018-02-06 | Payer: MEDICARE

## 2018-02-06 DIAGNOSIS — C3490 Malignant neoplasm of unspecified part of unspecified bronchus or lung: Principal | ICD-10-CM

## 2018-02-06 DIAGNOSIS — R222 Localized swelling, mass and lump, trunk: Secondary | ICD-10-CM

## 2018-03-07 ENCOUNTER — Encounter: Admit: 2018-03-07 | Discharge: 2018-03-08 | Payer: MEDICARE

## 2018-03-07 DIAGNOSIS — I5022 Chronic systolic (congestive) heart failure: Secondary | ICD-10-CM

## 2018-03-07 DIAGNOSIS — J449 Chronic obstructive pulmonary disease, unspecified: Secondary | ICD-10-CM

## 2018-03-07 DIAGNOSIS — N390 Urinary tract infection, site not specified: Principal | ICD-10-CM

## 2018-03-07 DIAGNOSIS — I4819 Other persistent atrial fibrillation: Secondary | ICD-10-CM

## 2018-03-07 MED ORDER — FUROSEMIDE 20 MG TABLET
ORAL_TABLET | Freq: Every day | ORAL | 0 refills | 0.00000 days | Status: CP | PRN
Start: 2018-03-07 — End: 2018-08-18

## 2018-03-07 MED ORDER — METOPROLOL TARTRATE 25 MG TABLET
ORAL_TABLET | Freq: Two times a day (BID) | ORAL | 1 refills | 0.00000 days | Status: CP
Start: 2018-03-07 — End: 2018-09-24

## 2018-03-07 MED ORDER — LOSARTAN 50 MG TABLET
ORAL_TABLET | Freq: Every day | ORAL | 4 refills | 0.00000 days | Status: CP
Start: 2018-03-07 — End: 2018-05-08

## 2018-03-07 MED ORDER — FINASTERIDE 5 MG TABLET
ORAL_TABLET | Freq: Every day | ORAL | 3 refills | 0 days | Status: CP
Start: 2018-03-07 — End: 2018-06-13

## 2018-03-07 MED ORDER — APIXABAN 5 MG TABLET
ORAL_TABLET | Freq: Two times a day (BID) | ORAL | 10 refills | 0 days | Status: CP
Start: 2018-03-07 — End: 2019-04-02

## 2018-03-07 MED ORDER — TIOTROPIUM BROMIDE 18 MCG CAPSULE WITH INHALATION DEVICE
Freq: Every day | RESPIRATORY_TRACT | 3 refills | 0.00000 days | Status: CP
Start: 2018-03-07 — End: ?

## 2018-03-09 MED ORDER — NITROFURANTOIN MONOHYDRATE/MACROCRYSTALS 100 MG CAPSULE
ORAL_CAPSULE | Freq: Two times a day (BID) | ORAL | 0 refills | 0 days | Status: CP
Start: 2018-03-09 — End: 2018-03-16

## 2018-04-18 ENCOUNTER — Encounter: Admit: 2018-04-18 | Discharge: 2018-04-19 | Payer: MEDICARE

## 2018-04-18 DIAGNOSIS — I10 Essential (primary) hypertension: Secondary | ICD-10-CM

## 2018-04-18 DIAGNOSIS — I4819 Other persistent atrial fibrillation: Secondary | ICD-10-CM

## 2018-04-18 DIAGNOSIS — I240 Acute coronary thrombosis not resulting in myocardial infarction: Principal | ICD-10-CM

## 2018-05-04 ENCOUNTER — Encounter: Admit: 2018-05-04 | Discharge: 2018-06-02 | Payer: MEDICARE

## 2018-05-04 ENCOUNTER — Ambulatory Visit: Admit: 2018-05-04 | Discharge: 2018-06-02 | Payer: MEDICARE

## 2018-05-04 ENCOUNTER — Encounter: Admit: 2018-05-04 | Discharge: 2018-05-17 | Payer: MEDICARE

## 2018-05-04 DIAGNOSIS — R079 Chest pain, unspecified: Secondary | ICD-10-CM

## 2018-05-04 DIAGNOSIS — I240 Acute coronary thrombosis not resulting in myocardial infarction: Principal | ICD-10-CM

## 2018-05-04 DIAGNOSIS — I2510 Atherosclerotic heart disease of native coronary artery without angina pectoris: Secondary | ICD-10-CM

## 2018-05-08 ENCOUNTER — Encounter
Admit: 2018-05-08 | Discharge: 2018-05-09 | Payer: MEDICARE | Attending: Cardiovascular Disease | Primary: Cardiovascular Disease

## 2018-05-08 ENCOUNTER — Non-Acute Institutional Stay: Admit: 2018-05-08 | Discharge: 2018-05-09 | Payer: MEDICARE

## 2018-05-08 ENCOUNTER — Encounter
Admit: 2018-05-08 | Discharge: 2018-05-09 | Payer: MEDICARE | Attending: Nurse Practitioner | Primary: Nurse Practitioner

## 2018-05-08 DIAGNOSIS — E785 Hyperlipidemia, unspecified: Secondary | ICD-10-CM

## 2018-05-08 DIAGNOSIS — I5022 Chronic systolic (congestive) heart failure: Secondary | ICD-10-CM

## 2018-05-08 DIAGNOSIS — I4819 Other persistent atrial fibrillation: Principal | ICD-10-CM

## 2018-06-13 MED ORDER — FINASTERIDE 5 MG TABLET
ORAL_TABLET | 1 refills | 0 days | Status: CP
Start: 2018-06-13 — End: 2018-10-04

## 2018-07-03 ENCOUNTER — Encounter: Admit: 2018-07-03 | Discharge: 2018-07-03 | Payer: MEDICARE

## 2018-07-03 ENCOUNTER — Encounter
Admit: 2018-07-03 | Discharge: 2018-07-03 | Payer: MEDICARE | Attending: Nurse Practitioner | Primary: Nurse Practitioner

## 2018-07-03 DIAGNOSIS — R222 Localized swelling, mass and lump, trunk: Principal | ICD-10-CM

## 2018-08-07 ENCOUNTER — Encounter
Admit: 2018-08-07 | Discharge: 2018-08-08 | Payer: MEDICARE | Attending: Nurse Practitioner | Primary: Nurse Practitioner

## 2018-08-07 DIAGNOSIS — C3412 Malignant neoplasm of upper lobe, left bronchus or lung: Principal | ICD-10-CM

## 2018-08-18 ENCOUNTER — Encounter: Admit: 2018-08-18 | Discharge: 2018-08-19 | Payer: MEDICARE

## 2018-08-18 DIAGNOSIS — J441 Chronic obstructive pulmonary disease with (acute) exacerbation: Secondary | ICD-10-CM

## 2018-08-18 DIAGNOSIS — J439 Emphysema, unspecified: Principal | ICD-10-CM

## 2018-08-18 MED ORDER — DOXYCYCLINE HYCLATE 100 MG TABLET
ORAL_TABLET | Freq: Two times a day (BID) | ORAL | 0 refills | 0 days | Status: CP
Start: 2018-08-18 — End: 2018-08-28

## 2018-09-24 MED ORDER — METOPROLOL TARTRATE 25 MG TABLET
ORAL_TABLET | Freq: Two times a day (BID) | ORAL | 1 refills | 0 days | Status: CP
Start: 2018-09-24 — End: 2019-02-12

## 2018-10-04 ENCOUNTER — Encounter: Admit: 2018-10-04 | Discharge: 2018-10-04 | Payer: MEDICARE | Attending: Urology | Primary: Urology

## 2018-10-04 DIAGNOSIS — N390 Urinary tract infection, site not specified: Secondary | ICD-10-CM

## 2018-10-04 DIAGNOSIS — N400 Enlarged prostate without lower urinary tract symptoms: Principal | ICD-10-CM

## 2018-10-04 MED ORDER — FINASTERIDE 5 MG TABLET
ORAL_TABLET | Freq: Every day | ORAL | 1 refills | 0 days | Status: CP
Start: 2018-10-04 — End: ?

## 2018-10-05 MED ORDER — TADALAFIL 20 MG TABLET
ORAL_TABLET | Freq: Every day | ORAL | 0 refills | 0.00000 days | Status: CP | PRN
Start: 2018-10-05 — End: ?

## 2019-02-12 ENCOUNTER — Encounter
Admit: 2019-02-12 | Discharge: 2019-02-13 | Payer: MEDICARE | Attending: Cardiovascular Disease | Primary: Cardiovascular Disease

## 2019-02-12 DIAGNOSIS — R0609 Other forms of dyspnea: Principal | ICD-10-CM

## 2019-02-12 DIAGNOSIS — I25118 Atherosclerotic heart disease of native coronary artery with other forms of angina pectoris: Secondary | ICD-10-CM

## 2019-02-12 MED ORDER — METOPROLOL SUCCINATE ER 50 MG TABLET,EXTENDED RELEASE 24 HR
ORAL_TABLET | Freq: Every day | ORAL | 6 refills | 0.00000 days | Status: CP
Start: 2019-02-12 — End: 2019-03-14

## 2019-03-16 ENCOUNTER — Encounter: Admit: 2019-03-16 | Discharge: 2019-03-17 | Payer: MEDICARE

## 2019-03-16 DIAGNOSIS — N390 Urinary tract infection, site not specified: Principal | ICD-10-CM

## 2019-03-21 MED ORDER — NITROFURANTOIN MONOHYDRATE/MACROCRYSTALS 100 MG CAPSULE
ORAL_CAPSULE | Freq: Two times a day (BID) | ORAL | 0 refills | 14 days | Status: CP
Start: 2019-03-21 — End: 2019-04-04

## 2019-03-22 MED ORDER — DIGOXIN 125 MCG (0.125 MG) TABLET
ORAL_TABLET | Freq: Every day | ORAL | 1 refills | 90.00000 days | Status: CP
Start: 2019-03-22 — End: ?

## 2019-04-03 ENCOUNTER — Encounter: Admit: 2019-04-03 | Discharge: 2019-04-03 | Payer: MEDICARE

## 2019-04-03 DIAGNOSIS — C3412 Malignant neoplasm of upper lobe, left bronchus or lung: Principal | ICD-10-CM

## 2019-04-03 DIAGNOSIS — D485 Neoplasm of uncertain behavior of skin: Principal | ICD-10-CM

## 2019-04-03 MED ORDER — APIXABAN 5 MG TABLET
ORAL_TABLET | Freq: Two times a day (BID) | ORAL | 10 refills | 30 days | Status: CP
Start: 2019-04-03 — End: ?

## 2019-04-16 ENCOUNTER — Encounter
Admit: 2019-04-16 | Discharge: 2019-04-17 | Payer: MEDICARE | Attending: Nurse Practitioner | Primary: Nurse Practitioner

## 2019-04-16 DIAGNOSIS — C3412 Malignant neoplasm of upper lobe, left bronchus or lung: Principal | ICD-10-CM

## 2019-06-25 ENCOUNTER — Encounter
Admit: 2019-06-25 | Discharge: 2019-06-26 | Payer: MEDICARE | Attending: Cardiovascular Disease | Primary: Cardiovascular Disease

## 2019-06-25 DIAGNOSIS — I25118 Atherosclerotic heart disease of native coronary artery with other forms of angina pectoris: Principal | ICD-10-CM

## 2019-06-27 DIAGNOSIS — E782 Mixed hyperlipidemia: Principal | ICD-10-CM

## 2019-06-27 MED ORDER — EVOLOCUMAB 140 MG/ML SUBCUTANEOUS PEN INJECTOR
INJECTION | SUBCUTANEOUS | 3 refills | 0 days | Status: CP
Start: 2019-06-27 — End: ?

## 2019-07-02 ENCOUNTER — Encounter: Admit: 2019-07-02 | Discharge: 2019-07-03 | Payer: MEDICARE | Attending: Pharmacotherapy | Primary: Pharmacotherapy

## 2019-07-02 DIAGNOSIS — E782 Mixed hyperlipidemia: Principal | ICD-10-CM

## 2019-07-02 DIAGNOSIS — I25118 Atherosclerotic heart disease of native coronary artery with other forms of angina pectoris: Principal | ICD-10-CM

## 2019-07-02 MED ORDER — EVOLOCUMAB 140 MG/ML SUBCUTANEOUS PEN INJECTOR
INJECTION | SUBCUTANEOUS | 3 refills | 0.00000 days | Status: CP
Start: 2019-07-02 — End: 2019-07-02

## 2019-07-02 MED ORDER — EVOLOCUMAB 140 MG/ML SUBCUTANEOUS PEN INJECTOR: 140 mg | mL | 2 refills | 0 days | Status: AC

## 2019-07-17 ENCOUNTER — Ambulatory Visit: Admit: 2019-07-17 | Discharge: 2019-07-18 | Payer: MEDICARE

## 2019-07-17 MED ORDER — DIGOXIN 125 MCG (0.125 MG) TABLET
ORAL_TABLET | Freq: Every day | ORAL | 1 refills | 90 days | Status: CP
Start: 2019-07-17 — End: ?

## 2019-07-17 MED ORDER — LOSARTAN 100 MG TABLET
ORAL_TABLET | Freq: Every day | ORAL | 11 refills | 30.00000 days | Status: CP
Start: 2019-07-17 — End: 2020-07-16

## 2019-07-17 MED ORDER — APIXABAN 5 MG TABLET
ORAL_TABLET | Freq: Two times a day (BID) | ORAL | 10 refills | 30.00000 days | Status: CP
Start: 2019-07-17 — End: ?

## 2019-07-30 ENCOUNTER — Encounter: Admit: 2019-07-30 | Discharge: 2019-07-31 | Payer: MEDICARE | Attending: Pharmacotherapy | Primary: Pharmacotherapy

## 2019-07-30 DIAGNOSIS — M549 Dorsalgia, unspecified: Principal | ICD-10-CM

## 2019-09-02 DIAGNOSIS — I2510 Atherosclerotic heart disease of native coronary artery without angina pectoris: Principal | ICD-10-CM

## 2019-09-10 ENCOUNTER — Encounter: Admit: 2019-09-10 | Discharge: 2019-09-11 | Payer: MEDICARE

## 2019-09-10 ENCOUNTER — Ambulatory Visit: Admit: 2019-09-10 | Discharge: 2019-09-11 | Payer: MEDICARE

## 2019-09-10 MED ORDER — FINASTERIDE 5 MG TABLET
ORAL_TABLET | Freq: Every day | ORAL | 1 refills | 90 days | Status: CP
Start: 2019-09-10 — End: ?

## 2019-09-17 ENCOUNTER — Ambulatory Visit: Admit: 2019-09-17 | Discharge: 2019-10-16 | Payer: MEDICARE

## 2019-09-17 ENCOUNTER — Encounter: Admit: 2019-09-17 | Discharge: 2019-10-16 | Payer: MEDICARE

## 2019-09-17 ENCOUNTER — Encounter: Admit: 2019-09-17 | Discharge: 2019-09-30 | Payer: MEDICARE

## 2019-10-14 ENCOUNTER — Ambulatory Visit: Admit: 2019-10-14 | Discharge: 2019-10-15 | Payer: MEDICARE | Attending: Family Medicine | Primary: Family Medicine

## 2019-10-15 ENCOUNTER — Encounter
Admit: 2019-10-15 | Discharge: 2019-10-15 | Payer: MEDICARE | Attending: Nurse Practitioner | Primary: Nurse Practitioner

## 2019-10-15 ENCOUNTER — Encounter: Admit: 2019-10-15 | Discharge: 2019-10-15 | Payer: MEDICARE

## 2019-10-15 DIAGNOSIS — C3412 Malignant neoplasm of upper lobe, left bronchus or lung: Principal | ICD-10-CM

## 2019-10-29 ENCOUNTER — Encounter: Admit: 2019-10-29 | Discharge: 2019-10-30 | Payer: MEDICARE

## 2019-10-29 ENCOUNTER — Ambulatory Visit
Admit: 2019-10-29 | Discharge: 2019-10-30 | Payer: MEDICARE | Attending: Cardiovascular Disease | Primary: Cardiovascular Disease

## 2019-10-29 DIAGNOSIS — I25118 Atherosclerotic heart disease of native coronary artery with other forms of angina pectoris: Principal | ICD-10-CM

## 2019-10-29 DIAGNOSIS — I10 Essential (primary) hypertension: Principal | ICD-10-CM

## 2019-10-29 MED ORDER — METOPROLOL SUCCINATE ER 25 MG TABLET,EXTENDED RELEASE 24 HR
ORAL_TABLET | 4 refills | 0 days | Status: CP
Start: 2019-10-29 — End: ?

## 2019-10-29 MED ORDER — EZETIMIBE 10 MG TABLET
ORAL_TABLET | Freq: Every day | ORAL | 3 refills | 90.00000 days | Status: CP
Start: 2019-10-29 — End: 2020-10-28

## 2019-10-31 DIAGNOSIS — I25118 Atherosclerotic heart disease of native coronary artery with other forms of angina pectoris: Principal | ICD-10-CM

## 2019-10-31 DIAGNOSIS — I4819 Other persistent atrial fibrillation: Principal | ICD-10-CM

## 2019-10-31 MED ORDER — METOPROLOL SUCCINATE ER 50 MG TABLET,EXTENDED RELEASE 24 HR
ORAL_TABLET | Freq: Every day | ORAL | 3 refills | 90.00000 days | Status: CP
Start: 2019-10-31 — End: 2019-11-30

## 2019-11-27 ENCOUNTER — Encounter: Admit: 2019-11-27 | Discharge: 2019-11-28 | Payer: MEDICARE

## 2019-11-27 MED ORDER — METOPROLOL SUCCINATE ER 50 MG TABLET,EXTENDED RELEASE 24 HR
ORAL_TABLET | Freq: Every day | ORAL | 3 refills | 45 days | Status: CP
Start: 2019-11-27 — End: ?

## 2019-12-05 DIAGNOSIS — M109 Gout, unspecified: Principal | ICD-10-CM

## 2019-12-05 MED ORDER — PREDNISONE 10 MG TABLET: tablet | 0 refills | 0 days | Status: AC

## 2019-12-05 MED ORDER — PREDNISONE 10 MG TABLET
ORAL_TABLET | ORAL | 0 refills | 0.00000 days | Status: CP
Start: 2019-12-05 — End: 2019-12-05

## 2019-12-06 DIAGNOSIS — M109 Gout, unspecified: Principal | ICD-10-CM

## 2019-12-06 MED ORDER — COLCHICINE 0.6 MG TABLET
ORAL_TABLET | Freq: Every day | ORAL | 1 refills | 30 days | Status: CP | PRN
Start: 2019-12-06 — End: ?

## 2019-12-23 ENCOUNTER — Ambulatory Visit: Admit: 2019-12-23 | Discharge: 2019-12-24 | Payer: MEDICARE

## 2019-12-23 ENCOUNTER — Ambulatory Visit: Admit: 2019-12-23 | Discharge: 2019-12-24 | Payer: MEDICARE | Attending: Urology | Primary: Urology

## 2019-12-23 DIAGNOSIS — R399 Unspecified symptoms and signs involving the genitourinary system: Principal | ICD-10-CM

## 2019-12-23 DIAGNOSIS — N400 Enlarged prostate without lower urinary tract symptoms: Principal | ICD-10-CM

## 2019-12-23 MED ORDER — FINASTERIDE 5 MG TABLET
ORAL_TABLET | Freq: Every day | ORAL | 3 refills | 90.00000 days | Status: CP
Start: 2019-12-23 — End: ?

## 2019-12-23 MED ORDER — METOPROLOL SUCCINATE ER 50 MG TABLET,EXTENDED RELEASE 24 HR
ORAL_TABLET | 3 refills | 0 days
Start: 2019-12-23 — End: ?

## 2019-12-23 MED ORDER — TADALAFIL 20 MG TABLET
ORAL_TABLET | Freq: Every day | ORAL | 3 refills | 30.00000 days | Status: CP | PRN
Start: 2019-12-23 — End: ?

## 2019-12-29 DIAGNOSIS — M109 Gout, unspecified: Principal | ICD-10-CM

## 2019-12-30 MED ORDER — COLCHICINE 0.6 MG TABLET
ORAL_TABLET | Freq: Every day | ORAL | 1 refills | 30 days | Status: CP | PRN
Start: 2019-12-30 — End: ?

## 2020-01-07 ENCOUNTER — Encounter: Admit: 2020-01-07 | Discharge: 2020-01-08 | Payer: MEDICARE

## 2020-01-07 MED ORDER — SULFAMETHOXAZOLE 400 MG-TRIMETHOPRIM 80 MG TABLET
ORAL_TABLET | Freq: Two times a day (BID) | ORAL | 0 refills | 7.00000 days | Status: CP
Start: 2020-01-07 — End: 2020-01-14

## 2020-01-07 MED ORDER — SULFAMETHOXAZOLE 400 MG-TRIMETHOPRIM 80 MG TABLET: 2 | tablet | Freq: Two times a day (BID) | 0 refills | 7 days | Status: AC

## 2020-01-14 ENCOUNTER — Encounter: Admit: 2020-01-14 | Discharge: 2020-01-15 | Payer: MEDICARE

## 2020-01-14 MED ORDER — TAMSULOSIN 0.4 MG CAPSULE
ORAL_CAPSULE | Freq: Every day | ORAL | 3 refills | 90.00000 days | Status: CP
Start: 2020-01-14 — End: 2021-01-13

## 2020-02-06 ENCOUNTER — Encounter
Admit: 2020-02-06 | Discharge: 2020-03-06 | Payer: MEDICARE | Attending: Rehabilitative and Restorative Service Providers" | Primary: Rehabilitative and Restorative Service Providers"

## 2020-02-10 ENCOUNTER — Ambulatory Visit: Admit: 2020-02-10 | Payer: MEDICARE | Attending: Urology | Primary: Urology

## 2020-02-11 DIAGNOSIS — I4819 Other persistent atrial fibrillation: Principal | ICD-10-CM

## 2020-02-11 MED ORDER — DIGOXIN 125 MCG (0.125 MG) TABLET
ORAL_TABLET | Freq: Every day | ORAL | 1 refills | 90 days | Status: CP
Start: 2020-02-11 — End: ?

## 2020-04-14 ENCOUNTER — Encounter
Admit: 2020-04-14 | Discharge: 2020-04-14 | Payer: MEDICARE | Attending: Nurse Practitioner | Primary: Nurse Practitioner

## 2020-04-14 ENCOUNTER — Ambulatory Visit: Admit: 2020-04-14 | Discharge: 2020-04-14 | Payer: MEDICARE

## 2020-04-29 ENCOUNTER — Encounter: Admit: 2020-04-29 | Discharge: 2020-04-30 | Payer: MEDICARE | Attending: Family Medicine | Primary: Family Medicine

## 2020-05-05 DIAGNOSIS — I4819 Other persistent atrial fibrillation: Principal | ICD-10-CM

## 2020-05-05 MED ORDER — DIGOXIN 125 MCG (0.125 MG) TABLET
ORAL_TABLET | Freq: Every day | ORAL | 1 refills | 90 days | Status: CP
Start: 2020-05-05 — End: ?

## 2020-05-20 ENCOUNTER — Ambulatory Visit: Admit: 2020-05-20 | Payer: MEDICARE

## 2020-05-20 ENCOUNTER — Encounter
Admit: 2020-05-20 | Discharge: 2020-05-20 | Disposition: A | Discharge status: Home / Self Care | Payer: MEDICARE | Attending: Emergency Medicine

## 2020-05-20 DIAGNOSIS — R040 Epistaxis: Principal | ICD-10-CM

## 2020-05-20 MED ORDER — FLUTICASONE PROPIONATE 50 MCG/ACTUATION NASAL SPRAY,SUSPENSION
Freq: Every day | NASAL | 0 refills | 0 days | Status: CP
Start: 2020-05-20 — End: 2021-05-20

## 2020-05-21 ENCOUNTER — Ambulatory Visit: Admit: 2020-05-21 | Discharge: 2020-05-22 | Payer: MEDICARE | Attending: Adult Health | Primary: Adult Health

## 2020-05-21 DIAGNOSIS — I10 Essential (primary) hypertension: Principal | ICD-10-CM

## 2020-05-21 DIAGNOSIS — R040 Epistaxis: Principal | ICD-10-CM

## 2020-05-22 ENCOUNTER — Ambulatory Visit: Admit: 2020-05-22 | Discharge: 2020-05-23 | Payer: MEDICARE

## 2020-05-22 DIAGNOSIS — J3489 Other specified disorders of nose and nasal sinuses: Principal | ICD-10-CM

## 2020-05-22 DIAGNOSIS — J0120 Acute ethmoidal sinusitis, unspecified: Principal | ICD-10-CM

## 2020-05-22 DIAGNOSIS — R040 Epistaxis: Principal | ICD-10-CM

## 2020-05-22 MED ORDER — AMOXICILLIN 875 MG-POTASSIUM CLAVULANATE 125 MG TABLET
ORAL_TABLET | Freq: Two times a day (BID) | ORAL | 0 refills | 10 days | Status: CP
Start: 2020-05-22 — End: 2020-06-01

## 2020-05-28 ENCOUNTER — Ambulatory Visit: Admit: 2020-05-28 | Discharge: 2020-05-29 | Payer: MEDICARE

## 2020-05-28 DIAGNOSIS — J0100 Acute maxillary sinusitis, unspecified: Principal | ICD-10-CM

## 2020-05-28 MED ORDER — DOXYCYCLINE HYCLATE 100 MG TABLET
ORAL_TABLET | Freq: Two times a day (BID) | ORAL | 0 refills | 10 days | Status: CP
Start: 2020-05-28 — End: 2020-06-07

## 2020-05-28 MED ORDER — MUPIROCIN 2 % TOPICAL OINTMENT
Freq: Two times a day (BID) | TOPICAL | 11 refills | 0 days | Status: CP
Start: 2020-05-28 — End: 2020-06-11

## 2020-05-29 ENCOUNTER — Encounter: Admit: 2020-05-29 | Discharge: 2020-05-30 | Payer: MEDICARE

## 2020-05-29 DIAGNOSIS — J3489 Other specified disorders of nose and nasal sinuses: Principal | ICD-10-CM

## 2020-05-29 DIAGNOSIS — R040 Epistaxis: Principal | ICD-10-CM

## 2020-07-22 DIAGNOSIS — I4819 Other persistent atrial fibrillation: Principal | ICD-10-CM

## 2020-07-22 MED ORDER — ELIQUIS 5 MG TABLET
ORAL_TABLET | 10 refills | 0 days | Status: CP
Start: 2020-07-22 — End: ?

## 2020-09-08 ENCOUNTER — Encounter: Admit: 2020-09-08 | Discharge: 2020-09-09 | Payer: MEDICARE

## 2020-09-08 ENCOUNTER — Encounter: Admit: 2020-09-08 | Discharge: 2020-09-09 | Payer: MEDICARE | Attending: Urology | Primary: Urology

## 2020-09-08 DIAGNOSIS — N400 Enlarged prostate without lower urinary tract symptoms: Principal | ICD-10-CM

## 2020-09-08 DIAGNOSIS — R040 Epistaxis: Principal | ICD-10-CM

## 2020-09-08 DIAGNOSIS — I10 Essential (primary) hypertension: Principal | ICD-10-CM

## 2020-09-08 DIAGNOSIS — I5022 Chronic systolic (congestive) heart failure: Principal | ICD-10-CM

## 2020-09-08 DIAGNOSIS — I4819 Other persistent atrial fibrillation: Principal | ICD-10-CM

## 2020-09-08 MED ORDER — SODIUM CHLORIDE-ALOE VERA NASAL GEL
Freq: Once | 0 refills | 0 days | Status: CP
Start: 2020-09-08 — End: 2020-09-08

## 2020-09-10 ENCOUNTER — Ambulatory Visit: Admit: 2020-09-10 | Discharge: 2020-09-11 | Payer: MEDICARE

## 2020-09-10 DIAGNOSIS — I5022 Chronic systolic (congestive) heart failure: Principal | ICD-10-CM

## 2020-09-21 DIAGNOSIS — I5022 Chronic systolic (congestive) heart failure: Principal | ICD-10-CM

## 2020-09-21 DIAGNOSIS — R040 Epistaxis: Principal | ICD-10-CM

## 2020-09-21 DIAGNOSIS — N400 Enlarged prostate without lower urinary tract symptoms: Principal | ICD-10-CM

## 2020-09-21 MED ORDER — TADALAFIL 5 MG TABLET
ORAL_TABLET | Freq: Every day | ORAL | 11 refills | 30 days | Status: CP | PRN
Start: 2020-09-21 — End: 2020-10-21

## 2020-09-24 MED ORDER — NITROFURANTOIN MONOHYDRATE/MACROCRYSTALS 100 MG CAPSULE
ORAL_CAPSULE | Freq: Two times a day (BID) | ORAL | 0 refills | 14 days | Status: CP
Start: 2020-09-24 — End: 2020-10-08

## 2020-10-09 ENCOUNTER — Ambulatory Visit: Admit: 2020-10-09 | Discharge: 2020-10-09 | Payer: MEDICARE

## 2020-10-09 DIAGNOSIS — R399 Unspecified symptoms and signs involving the genitourinary system: Principal | ICD-10-CM

## 2020-10-09 DIAGNOSIS — N390 Urinary tract infection, site not specified: Principal | ICD-10-CM

## 2020-10-09 DIAGNOSIS — N400 Enlarged prostate without lower urinary tract symptoms: Principal | ICD-10-CM

## 2020-11-08 DIAGNOSIS — I4819 Other persistent atrial fibrillation: Principal | ICD-10-CM

## 2020-11-09 MED ORDER — DIGOXIN 125 MCG (0.125 MG) TABLET
ORAL_TABLET | 1 refills | 0 days | Status: CP
Start: 2020-11-09 — End: ?

## 2020-11-13 MED ORDER — EZETIMIBE 10 MG TABLET
ORAL_TABLET | 3 refills | 0 days | Status: CP
Start: 2020-11-13 — End: ?

## 2020-11-23 ENCOUNTER — Encounter: Admit: 2020-11-23 | Discharge: 2020-11-24 | Payer: MEDICARE

## 2020-11-23 MED ORDER — TADALAFIL 20 MG TABLET
ORAL_TABLET | Freq: Every day | ORAL | 3 refills | 30.00000 days | Status: CP | PRN
Start: 2020-11-23 — End: 2020-11-23

## 2020-11-23 MED ORDER — NITROFURANTOIN MONOHYDRATE/MACROCRYSTALS 100 MG CAPSULE
ORAL_CAPSULE | Freq: Two times a day (BID) | ORAL | 0 refills | 7 days | Status: CP
Start: 2020-11-23 — End: 2020-11-30

## 2020-12-09 ENCOUNTER — Ambulatory Visit: Admit: 2020-12-09 | Discharge: 2020-12-10 | Payer: MEDICARE

## 2020-12-15 ENCOUNTER — Ambulatory Visit: Admit: 2020-12-15 | Payer: MEDICARE | Attending: Cardiovascular Disease | Primary: Cardiovascular Disease

## 2020-12-18 ENCOUNTER — Ambulatory Visit: Admit: 2020-12-18 | Payer: MEDICARE | Attending: Urology | Primary: Urology

## 2021-01-21 DIAGNOSIS — M79672 Pain in left foot: Principal | ICD-10-CM

## 2021-01-21 DIAGNOSIS — M79671 Pain in right foot: Principal | ICD-10-CM

## 2021-01-23 DIAGNOSIS — R399 Unspecified symptoms and signs involving the genitourinary system: Principal | ICD-10-CM

## 2021-01-25 MED ORDER — FINASTERIDE 5 MG TABLET
ORAL_TABLET | 3 refills | 0 days | Status: CP
Start: 2021-01-25 — End: ?

## 2021-02-02 ENCOUNTER — Ambulatory Visit: Admit: 2021-02-02 | Discharge: 2021-02-03 | Payer: MEDICARE

## 2021-02-02 MED ORDER — FINASTERIDE 5 MG TABLET
ORAL_TABLET | Freq: Every day | ORAL | 3 refills | 90.00000 days | Status: CP
Start: 2021-02-02 — End: ?

## 2021-02-02 MED ORDER — EZETIMIBE 10 MG TABLET
ORAL_TABLET | Freq: Every day | ORAL | 3 refills | 90 days | Status: CP
Start: 2021-02-02 — End: ?

## 2021-02-02 MED ORDER — TADALAFIL 20 MG TABLET
ORAL_TABLET | Freq: Every day | ORAL | 6 refills | 30 days | Status: CP | PRN
Start: 2021-02-02 — End: ?

## 2021-02-02 MED ORDER — DIGOXIN 125 MCG (0.125 MG) TABLET
ORAL_TABLET | Freq: Every day | ORAL | 1 refills | 90 days | Status: CP
Start: 2021-02-02 — End: ?

## 2021-02-03 ENCOUNTER — Encounter: Admit: 2021-02-03 | Discharge: 2021-02-04 | Payer: MEDICARE

## 2021-02-05 DIAGNOSIS — M109 Gout, unspecified: Principal | ICD-10-CM

## 2021-02-05 DIAGNOSIS — N3001 Acute cystitis with hematuria: Principal | ICD-10-CM

## 2021-02-05 MED ORDER — COLCHICINE 0.6 MG TABLET
ORAL_TABLET | Freq: Every day | ORAL | 1 refills | 30 days | Status: CP | PRN
Start: 2021-02-05 — End: ?

## 2021-02-05 MED ORDER — NITROFURANTOIN MONOHYDRATE/MACROCRYSTALS 100 MG CAPSULE
ORAL_CAPSULE | Freq: Two times a day (BID) | ORAL | 0 refills | 7 days | Status: CP
Start: 2021-02-05 — End: 2021-02-12

## 2021-02-16 ENCOUNTER — Encounter
Admit: 2021-02-16 | Discharge: 2021-02-17 | Payer: MEDICARE | Attending: Cardiovascular Disease | Primary: Cardiovascular Disease

## 2021-02-16 DIAGNOSIS — I10 Essential (primary) hypertension: Principal | ICD-10-CM

## 2021-02-16 DIAGNOSIS — M109 Gout, unspecified: Principal | ICD-10-CM

## 2021-02-16 DIAGNOSIS — I5022 Chronic systolic (congestive) heart failure: Principal | ICD-10-CM

## 2021-02-16 DIAGNOSIS — E785 Hyperlipidemia, unspecified: Principal | ICD-10-CM

## 2021-02-16 DIAGNOSIS — J439 Emphysema, unspecified: Principal | ICD-10-CM

## 2021-02-16 DIAGNOSIS — I2510 Atherosclerotic heart disease of native coronary artery without angina pectoris: Principal | ICD-10-CM

## 2021-02-16 MED ORDER — REPATHA SURECLICK 140 MG/ML SUBCUTANEOUS PEN INJECTOR
SUBCUTANEOUS | 23 refills | 0.00000 days | Status: CP
Start: 2021-02-16 — End: 2022-02-16

## 2021-02-16 MED ORDER — LOSARTAN 100 MG TABLET
ORAL_TABLET | Freq: Every day | ORAL | 11 refills | 60.00000 days | Status: CP
Start: 2021-02-16 — End: 2022-02-16

## 2021-02-16 MED ORDER — COLCHICINE 0.6 MG TABLET
ORAL_TABLET | Freq: Every day | ORAL | 1 refills | 30.00000 days | Status: CP | PRN
Start: 2021-02-16 — End: ?

## 2021-02-16 MED ORDER — ALBUTEROL SULFATE HFA 90 MCG/ACTUATION AEROSOL INHALER
Freq: Four times a day (QID) | RESPIRATORY_TRACT | 1 refills | 0 days | Status: CP | PRN
Start: 2021-02-16 — End: 2022-02-16

## 2021-02-19 NOTE — Unmapped (Signed)
[facility] Specialty Medication Onboarding    Specialty Medication: Repatha  Prior Authorization: Approved   Financial Assistance: No - patient doesn't qualify for additional assistance   Final Copay/Day Supply: $47 / 28    Insurance Restrictions: None     Notes to Pharmacist: n/a    The triage team has completed the benefits investigation and has determined that the patient is able to fill this medication at [facility]. Please contact the patient to complete the onboarding or follow up with the prescribing physician as needed.

## 2021-02-24 NOTE — Unmapped (Unsigned)
Repatha onboarding incomplete. Spoke with patient but he would like to talk with doctor more before onboarding. Copay $47/ 28 days.     Ryan Murray  [facility] Specialty Pharmacist You will hear a click that signals the start of the injection  o Continue to press the pen to your skin and lift your thumb  o The injection may take up to 15 seconds. You will know the injection is complete when the medication window turns yellow. You may also hear a second click.  o Remove the pen from your skin and discard the pen in a sharps container.  o If there is blood at the injection site, press a cotton ball or gauze to the site. Do not rub the injection site.    Adherence/Missed dose instructions: Administer a missed dose within 7 days and resume your normal schedule.  If it has been more than 7 days and you inject every 2 weeks, skip the missed dose and resume your normal schedule..     Goals of Therapy     Lower cholesterol, prevention of cardiovascular events in patients with established cardiovascular disease    Side Effects & Monitoring Parameters   ??? Flu-like symptoms  ??? Signs of a common cold  ??? Back pain  ??? Injection site irritation  ??? Nose or throat irritation    The following side effects should be reported to the provider:  ??? Signs of an allergic reaction  ??? Signs of high blood sugar (confusion, drowsiness, increase thirst/hunger/urination, fast breathing, flushing)      Contraindications, Warnings, & Precautions     ??? Latex (the packaging of Repatha may contain natural rubber)    Drug/Food Interactions     ??? Medication list reviewed in Epic. The patient was instructed to inform the care team before taking any new medications or supplements. No drug interactions identified.     Storage, Handling Precautions, & Disposal   ??? Repatha should be stored in the refrigerator. If necessary, Repatha may be kept at room temperature for no more than 30 days.  ??? Place used devices in a sharps container for disposal.      Current Medications (including OTC/herbals), Comorbidities and Allergies     Current Outpatient Medications   Medication Sig Dispense Refill   ??? albuterol HFA 90 mcg/actuation inhaler Inhale 2 puffs every six (6) hours as needed for wheezing. 8.5 g 1   ??? aspirin (ECOTRIN) 81 MG tablet Take 1 tablet (81 mg total) by mouth daily. Frequency:QD   Dosage:81   MG  Instructions:  Note:Dose: 81MG  30 tablet 0   ??? colchicine (COLCRYS) 0.6 mg tablet Take 1 tablet (0.6 mg total) by mouth as needed in the morning. 30 tablet 1   ??? digoxin (LANOXIN) 125 mcg (0.125 mg) tablet Take 1 tablet (125 mcg total) by mouth in the morning. 90 tablet 1   ??? ELIQUIS 5 mg Tab TAKE 1 TABLET (5 MG TOTAL) BY MOUTH TWO (2) TIMES A DAY. (Patient taking differently: Patient taking once daily) 60 tablet 10   ??? evolocumab (REPATHA SURECLICK) 140 mg/mL PnIj Inject the contents of one pen (140 mg) under the skin every fourteen (14) days. 1 mL 23   ??? ezetimibe (ZETIA) 10 mg tablet Take 1 tablet (10 mg total) by mouth in the morning. 90 tablet 3   ??? finasteride (PROSCAR) 5 mg tablet Take 1 tablet (5 mg total) by mouth in the morning. 90 tablet 3   ???  fluticasone propionate (FLONASE) 50 mcg/actuation nasal spray 2 sprays into each nostril daily. 16 g 0   ??? losartan (COZAAR) 100 MG tablet Take 0.5 tablets (50 mg total) by mouth in the morning. For high blood pressure. 30 tablet 11   ??? metoprolol succinate (TOPROL XL) 50 MG 24 hr tablet Take 50mg  in AM and 25 mg in PM (Patient taking differently: Take 50mg  in AM and 50 mg in PM) 90 tablet 3   ??? metoprolol succinate (TOPROL-XL) 25 MG 24 hr tablet      ??? multivit-min/folic/vit K/lycop (ONE-A-DAY MEN'S MULTIVITAMIN ORAL) Take 1 tablet by mouth daily.     ??? neomycin-polymyxin-dexamethasone (MAXITROL) 3.5mg /mL-10,000 unit/mL-0.1 % ophthalmic suspension INSTILL 1 DROP INTO BOTH EYES 4 TIMES A DAY FOR 7 DAYS     ??? tadalafiL 20 MG tablet Take 1 tablet (20 mg total) by mouth as needed in the morning for erectile dysfunction. 30 tablet 6   ??? tobramycin (TOBREX) 0.3 % ophthalmic solution Administer 1 drop to both eyes daily.        Current Facility-Administered Medications   Medication Dose Route Frequency Provider Last Rate Last Admin   ??? lidocaine 2% gel (XYLOCAINE) jelly urojet 20 mL  20 mL Urethral Once Vickii Penna, MD       ??? sodium chloride irrigation (NS) 0.9 % irrigation solution   Irrigation Once Vickii Penna, MD           Allergies   Allergen Reactions   ??? Atorvastatin Other (See Comments)     Leg pain, upper arm muscle weakness and sensation of atrophy.   ??? Pravastatin Other (See Comments)     hematuria       Patient Active Problem List   Diagnosis   ??? Benign essential hypertension (RAF-HCC)   ??? Chronic systolic CHF (congestive heart failure), NYHA class 1 (CMS-HCC)   ??? CKD (chronic kidney disease), stage II   ??? Hyperlipidemia LDL goal <70   ??? Benign non-nodular prostatic hyperplasia with lower urinary tract symptoms   ??? Coronary atherosclerosis of native coronary artery   ??? Elevated PSA   ??? Persistent atrial fibrillation (CMS-HCC)   ??? Lower urinary tract symptoms (LUTS)   ??? Complex tear of lateral meniscus of right knee, subsequent encounter   ??? Gross hematuria   ??? Chronic idiopathic gout of multiple sites   ??? Other male erectile dysfunction   ??? Type 2 diabetes mellitus without complication, without long-term current use of insulin (CMS-HCC)       Reviewed and up to date in Epic.    Appropriateness of Therapy     Acute infections noted within Epic:  No active infections  Patient reported infection: {Blank single:19197::None,***- patient reported to provider,***- pharmacy reported to provider}    Is medication and dose appropriate based on diagnosis and infection status? {Blank single:19197::Yes,No - evidence provided by prescriber in *** note}    Prescription has been clinically reviewed: Yes      Baseline Quality of Life Assessment      How many days over the past month did your hyperlipidemia  keep you from your normal activities? For example, brushing your teeth or getting up in the morning. {Blank:19197::0,***,Patient declined to answer}    Financial Information     Medication Assistance provided: Prior Authorization    Anticipated copay of $47 (28 days) reviewed with patient. Verified delivery address.    Delivery Information     Scheduled delivery date: ***    Expected  start date: ***    Medication will be delivered via Same Day Courier to the {Blank:19197::prescription,temporary} address in [facility].  This shipment will not require a signature.      Explained the services we provide at [facility] and that each month we would call to set up refills.  Stressed importance of returning phone calls so that we could ensure they receive their medications in time each month.  Informed patient that we should be setting up refills 7-10 days prior to when they will run out of medication.  A pharmacist will reach out to perform a clinical assessment periodically.  Informed patient that a welcome packet, containing information about our pharmacy and other support services, a Notice of Privacy Practices, and a drug information handout will be sent.      The patient or caregiver noted above participated in the development of this care plan and knows that they can request review of or adjustments to the care plan at any time.      Patient or caregiver verbalized understanding of the above information as well as how to contact the pharmacy at [phone] option 4 with any questions/concerns.  The pharmacy is open Monday through Friday 8:30am-4:30pm.  A pharmacist is available 24/7 via pager to answer any clinical questions they may have.    Patient Specific Needs     - Does the patient have any physical, cognitive, or cultural barriers? {Blank single:19197::No,Yes - ***}    - Does the patient have adequate living arrangements? (i.e. the ability to store and take their medication appropriately) {Blank single:19197::Yes,No - ***}    - Did you identify any home environmental safety or security hazards? {Blank single:19197::No,Yes - ***}    - Patient prefers to have medications discussed with  {Blank single:19197::Patient,Family Member,Caregiver,Other}     - Is the patient or caregiver able to read and understand education materials at a high school level or above? {Blank single:19197::No,Yes}    - Patient's primary language is  {Blank single:19197::English,Spanish,***}     - Is the patient high risk? {sschighriskpts:78327}    - Does the patient require physician intervention or other additional services (i.e. dietary/nutrition, smoking cessation, social work)? {Blank single:19197::No,Yes - ***}      Ryan Murray  [facility] Specialty Pharmacist

## 2021-03-14 NOTE — Unmapped (Unsigned)
New Patient Clinic Note    Referring Physician :  Milus Banister    History of Present Illness:    Ryan Murray is a 75 y.o. male with history of idiopathic gout, T2DM, CAD, paroxysmal A-fib, HTN, CHF, lung cancer, liver disease, CKD, Hep B and mild cognitive impairment who presents for new patient evaluation of bilateral foot pain.     Her latest a1c from 02/02/21 was 6.8%.    ***    Review of Systems:    Musculoskeletal:  Denies back pain or joint pain   Integument:  Denies rash   Neurologic:  Denies headache, focal weakness or sensory changes     There were no vitals taken for this visit.    Allergies:  Allergies   Allergen Reactions   ??? Atorvastatin Other (See Comments)     Leg pain, upper arm muscle weakness and sensation of atrophy.   ??? Pravastatin Other (See Comments)     hematuria       Medications:   Outpatient Encounter Medications as of 03/15/2021   Medication Sig Dispense Refill   ??? albuterol HFA 90 mcg/actuation inhaler Inhale 2 puffs every six (6) hours as needed for wheezing. 8.5 g 1   ??? aspirin (ECOTRIN) 81 MG tablet Take 1 tablet (81 mg total) by mouth daily. Frequency:QD   Dosage:81   MG  Instructions:  Note:Dose: 81MG  30 tablet 0   ??? colchicine (COLCRYS) 0.6 mg tablet Take 1 tablet (0.6 mg total) by mouth as needed in the morning. 30 tablet 1   ??? digoxin (LANOXIN) 125 mcg (0.125 mg) tablet Take 1 tablet (125 mcg total) by mouth in the morning. 90 tablet 1   ??? ELIQUIS 5 mg Tab TAKE 1 TABLET (5 MG TOTAL) BY MOUTH TWO (2) TIMES A DAY. (Patient taking differently: Patient taking once daily) 60 tablet 10   ??? evolocumab (REPATHA SURECLICK) 140 mg/mL PnIj Inject the contents of one pen (140 mg) under the skin every fourteen (14) days. 1 mL 23   ??? ezetimibe (ZETIA) 10 mg tablet Take 1 tablet (10 mg total) by mouth in the morning. 90 tablet 3   ??? finasteride (PROSCAR) 5 mg tablet Take 1 tablet (5 mg total) by mouth in the morning. 90 tablet 3   ??? fluticasone propionate (FLONASE) 50 mcg/actuation nasal spray 2 sprays into each nostril daily. 16 g 0   ??? losartan (COZAAR) 100 MG tablet Take 0.5 tablets (50 mg total) by mouth in the morning. For high blood pressure. 30 tablet 11   ??? metoprolol succinate (TOPROL XL) 50 MG 24 hr tablet Take 50mg  in AM and 25 mg in PM (Patient taking differently: Take 50mg  in AM and 50 mg in PM) 90 tablet 3   ??? metoprolol succinate (TOPROL-XL) 25 MG 24 hr tablet      ??? multivit-min/folic/vit K/lycop (ONE-A-DAY MEN'S MULTIVITAMIN ORAL) Take 1 tablet by mouth daily.     ??? neomycin-polymyxin-dexamethasone (MAXITROL) 3.5mg /mL-10,000 unit/mL-0.1 % ophthalmic suspension INSTILL 1 DROP INTO BOTH EYES 4 TIMES A DAY FOR 7 DAYS     ??? [EXPIRED] nitrofurantoin, macrocrystal-monohydrate, (MACROBID) 100 MG capsule Take 1 capsule (100 mg total) by mouth Two (2) times a day for 7 days. 14 capsule 0   ??? tadalafiL 20 MG tablet Take 1 tablet (20 mg total) by mouth as needed in the morning for erectile dysfunction. 30 tablet 6   ??? tobramycin (TOBREX) 0.3 % ophthalmic solution Administer 1 drop to both eyes daily.  Facility-Administered Encounter Medications as of 03/15/2021   Medication Dose Route Frequency Provider Last Rate Last Admin   ??? lidocaine 2% gel (XYLOCAINE) jelly urojet 20 mL  20 mL Urethral Once Vickii Penna, MD       ??? sodium chloride irrigation (NS) 0.9 % irrigation solution   Irrigation Once Vickii Penna, MD         Medical History:  Past Medical History:   Diagnosis Date   ??? Allergic    ??? Bladder polyps    ??? CAD (coronary artery disease) 09/2012    s/p BMS to 70% LCx   ??? CHF (congestive heart failure), NYHA class I (CMS-HCC)     EF 40-45% in 09/2012 on cath   ??? 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 CKD (chronic kidney disease) stage 2, GFR 60-89 ml/min     baseline creatinine 1.3   ??? Dysplastic nevi    ??? Heart disease    ??? Hepatitis B antibody positive    ??? HTN (hypertension)    ??? Liver disease    ??? Lung cancer (CMS-HCC)    ??? Mild cognitive impairment, so stated 11/11/2017   ??? Paroxysmal A-fib (CMS-HCC)     with RVR episodes in 2010/2014   ??? PVD (peripheral vascular disease) (CMS-HCC)    ??? 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 Type 2 diabetes mellitus without complication, without long-term current use of insulin (CMS-HCC) 02/02/2021       Surgical History:  Past Surgical History:   Procedure Laterality Date   ??? bladder polyp removal     ??? CARDIAC CATHETERIZATION     ??? CORONARY STENT PLACEMENT     ??? PR BRONCHOSCOPY,PLACEMENT FIDUCIAL MARKERS, 1/MULT N/A 11/27/2013    Procedure: BRONCHOSCOPY, RIGID OR FLEXIBLE, FLOURO WHEN PERFORMED; PLACEMENT OF FIDUCIAL MARKERS, SINGLE OR MULTIPLE;  Surgeon: Cyndy Freeze, MD;  Location: MA[facility];  Service: Cardiothoracic   ??? PR THORACOSCOPY W/DX WEDGE RESEXN ANATO LUNG RESEXN Left 11/27/2013    Procedure: THORACOSCOPY, SURGICAL; WITH DIAGNOSTIC WEDGE RESECTION FOLLOWED BY ANATOMIC LUNG RESECTION;  Surgeon: Cyndy Freeze, MD;  Location: MAIN OR [facility];  Service: Cardiothoracic     Social History:  Social History     Socioeconomic History   ??? Marital status: Married   Occupational History   ??? Occupation: truck Hospital doctor   Tobacco Use   ??? Smoking status: Former Smoker     Packs/day: 0.50     Years: 55.00     Pack years: 27.50     Types: Cigarettes     Quit date: 2010     Years since quitting: 12.5   ??? Smokeless tobacco: Never Used   Vaping Use   ??? Vaping Use: Never used   Substance and Sexual Activity   ??? Alcohol use: No     Alcohol/week: 0.0 standard drinks   ??? Drug use: No   Other Topics Concern   ??? Do you use sunscreen? No   ??? Tanning bed use? No   ??? Are you easily burned? No   ??? Excessive sun exposure? Yes   ??? Blistering sunburns? No   Social History Narrative    Retired Naval architect. Lives in [place] with wife Steward Drone      Family History:  Family History   Problem Relation Age of Onset   ??? Prostate cancer Father    ??? Heart disease Mother    ???  Diabetes Mother    ??? Diabetes type II Other      Physical Exam:   Consitutional: No acute distress, alert and oriented.    Vascular:   Palpable pulses of the DP and PT artery bilateral feet  Temperature gradient within normal limits  Capillary refill brisk x 10  No varicosities noted bilaterally    Neurologic:    Right Left   10 gram filament []  Present  []  Not present  []  Intermittent []  Present  []  Not present  []  Intermittent   Positional sense great toe joint []  Present  []  Not present []  Present  []  Not present   128 cps vibratory sense []  Diminished  []  Intact []  Diminished  []  Intact   Deep tendon reflexes 2/5 []  2/5 normal  []  Abnormal []  2/5 normal  []  Abnormal   Babinski sign []  Present  []  Not present []  Present  []  Not present      Orthopedic:   *** arch morphology   No evidence of Charcot    Dermatologic:   No callus development   No interdigital fissures or maceration bilateral feet     Test Results  ***    Imaging:   ***    Assessment/Recommendations:    1.     PROCEDURE:

## 2021-03-18 DIAGNOSIS — M109 Gout, unspecified: Principal | ICD-10-CM

## 2021-03-18 MED ORDER — COLCHICINE 0.6 MG TABLET
ORAL_TABLET | Freq: Every day | ORAL | 1 refills | 30 days | Status: CP | PRN
Start: 2021-03-18 — End: ?

## 2021-03-18 NOTE — Unmapped (Signed)
Refill request received for patient.      Medication Requested: Colchine  Last Office Visit: 02/16/2021   Next Office Visit: Visit date not found  Last Prescriber: Austin Miles    Nurse refill requirements met? Yes  If not met, why: n/a    Sent to: Provider for signing  If sent to provider, which provider?: [facility]

## 2021-03-26 ENCOUNTER — Ambulatory Visit: Admit: 2021-03-26 | Discharge: 2021-03-27 | Payer: MEDICARE

## 2021-03-31 ENCOUNTER — Encounter: Admit: 2021-03-31 | Discharge: 2021-04-01 | Payer: MEDICARE | Attending: Adult Health | Primary: Adult Health

## 2021-03-31 DIAGNOSIS — I5022 Chronic systolic (congestive) heart failure: Principal | ICD-10-CM

## 2021-03-31 DIAGNOSIS — I2510 Atherosclerotic heart disease of native coronary artery without angina pectoris: Principal | ICD-10-CM

## 2021-03-31 DIAGNOSIS — Z024 Encounter for examination for driving license: Principal | ICD-10-CM

## 2021-03-31 DIAGNOSIS — E785 Hyperlipidemia, unspecified: Principal | ICD-10-CM

## 2021-03-31 DIAGNOSIS — I4819 Other persistent atrial fibrillation: Principal | ICD-10-CM

## 2021-03-31 NOTE — Unmapped (Addendum)
Make sure to take eliquis twice daily    EF 40% (pumping function of heart) - this is the same as before.     I will have the pharmacist call you about the Repatha - strongly recommend this.

## 2021-03-31 NOTE — Unmapped (Signed)
Division of Cardiology       Date of Service: 03/31/2021    Return Patient Clinic Note    PCP: Referring Provider:   Cathlean Marseilles, DO  [id number] [facility] [place] [address]  Phone: [phone]  Fax: [phone] Milus Banister, DO  [address]  [id number]  [facility] [address]  [place],  [place] [address]  Phone: [phone]  Fax: [phone]     Assessment and Plan:      Mr. Narine is a 75 y.o. 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 He presents needing paperwork for CDL license.    Encounter for Department of Transportation (DOT) examination for trucking license  He appears to meet criteria for relicensure.  He had normal nuclear stress test less than 2 years ago and recent echo.  He is well compensated as noted below.  Paperwork completed    CAD - s/p PCI in 2014. Stress MPI at that time discovered lung mass on attenuation CT scan. His last two stress tests (04/2018, 08/2019) negative for significant ischemia or infarct.  Here stable, no angina.  -- continue toprol 75mg  daily  - ASA 81 mg daily, could consider discontinuing given stable coronary disease and concurrent Eliquis but will defer to primary cardiologist.    HLD  -- Has not tolerated several statins, is on Zetia only and LDL above goal.  Repatha was ordered at last visit but he has not started it.  It is affordable at $47 per month.  After discussions with him and his daughter guarding significant benefits, he is willing to start this.  I will message the shared services pharmacist.    Lab Results   Component Value Date    LDL 96 09/10/2020     Persistent atrial fibrillation - occasional palpitations, but no associated light headedness, syncope, or chest pain. This presentation is consistent with atrial fibrillation with RVR. He has been compliant with his rate control medications (metoprolol and digoxin) and is unaware of any provoking factors.   -- continue Toprol 75mg  daily, digoxin 125 mcg daily  -- apixaban 5 mg BID for stroke risk reduction (CHADS-VASc score of 5) -he is taking only once daily, clarified that needs to be twice daily    HFrEF with variable EF (prior recovery to 55%, followed by decrease to 40%)- chronic NYHA class II symtpoms. He appears euvolemic and well compensated at this time. Recent echo with EF 40%, unchanged from prior  -- Toprol 75mg  daily, losartan 50 mg daily  -- continue diuretic therapy with lasix 20 mg daily PRN, he has not used in many months.  -- Re-evaluate LV systolic function via echo, ordered    No follow-ups on file.     Future Appointments   Date Time Provider Department Center   05/25/2021  8:00 AM Jearl Klinefelter, DPM [facility]         Subjective:      Reason for Visit: CDL license renewal    History of Present Illness  Mr. Wilbourne is a 75 y.o. 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  He was last seen by Dr. Lynnae January in June and was doing well.  Decision was made to start on Repatha as he has not been able to tolerate statins.    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  He had recent echo 8/5 showing EF 40%, similar to prior.  No chest pain. Stable SOB w/ heavier exertion. No LE edema, PND/orthopnea  Taking eliquis only once daily.  Hasn't started Repatha, worried about injection and didn't know Dr..Stouffer wanted him to start. $47/month    Cardiovascular History  ?? Hypertension  ?? Hyperlipidemia  ?? Coronary artery disease S/P PCI with BMS to proximal LCx in 2014  ?? Persistent atrial fibrillation (on apixaban)  ?? HFrEF now with EF recovery (EF > 55% in Jan 2018)      Cardiovascular Studies     Date         Results     Cardiac Catheterization Feb 2014 1. Coronary artery disease including  a 70% proximal LCx stenosis.  2. Normal LV filling pressure (LVEDP = 10 mm Hg).  3. Severe lateral wall hypokinesis with mildly decreased LV contraction      (estimated LVEF 45%).  4. Peripheral arterial disease including up to 10% stenosis in the left       and right common iliac arteries and 20% stenosis in the left internal      iliac artery.   5. Successful PCI to the proximal LCx with an Integrity BMS.     Echo Aug 2014     Left ventricular  hypertrophy      Contractile  left ventricular  dysfunction  (moderate)      Segmental  contractile left  ventricular dysfunction      Decreased  left ventricular  ejection fraction  (35-40%)      Degenerative  mitral valve  disease      Mitral  regurgitation (mild)      Dilated  left atrium      Aortic  sclerosis      Normal  right ventricular  contractile performance     Stress Test (SPECT) Mar 2015 - Probably normal myocardial perfusion study  - There is a small in size, subtle in severity, fixed defect involving the   apical lateral and apical inferior segments. This is consistent with   possible artifact (attenuation and misregistration artifact) and cannot   rule out subtle scar.  - Post stress: Global systolic function is normal. The ejection fraction   calculated at 67%.   - A 15 mm nodule is noted in the left upper lobe and a 3 mm nodule in the right upper lobe. Comparison with previous studies or recommend follow-up CTs at around 3, 9, and 24 months or dynamic contrast enhanced CT, PET, and/or biopsy     Stress Test (PET) Nov 2015 - Abnormal myocardial perfusion study  - There is a moderate in size, subtle in severity, partially reversible defect involving the apical, apical anterior and apical septal segments. This is consistent with possible ischemia and scar and cannot rule out artifact.  - During stress: Global systolic function is moderately reduced. The ejection fraction calculated at 36%.   - Previously placed proximal LCx stent is seen on the attenuation CT.     Echo Nov 2015     Left ventricular  hypertrophy  Contractile  left ventricular  dysfunction  (moderate)      Segmental  contractile left  ventricular dysfunction        Inferior  myocardial infarction      Decreased  left ventricular  ejection fraction  (35-40%)      Degenerative  mitral valve  disease      Dilated  left atrium      Normal  right ventricular  contractile performance      Tricuspid  regurgitation  (trivial)     Echo June 2016     Left ventricular  hypertrophy  (mild)      Contractile  left ventricular  dysfunction  (moderate)      Decreased  left ventricular  ejection fraction  (40-45%)      Dilated  left atrium (moderate)      Aortic  sclerosis      Normal  right ventricular  contractile performance     Stress Test (PET) June 2016 - Abnormal myocardial perfusion study  - No evidence for significant ischemia or scar is noted.  - During stress: Global systolic function is severely reduced. The ejection fraction calculated at 24%. Right ventricular systolic function is moderately reduced.  - The apical defects on previous PET stress done on 06/23/14 are no longer seen.  - Incidentally noted on the attenuation CT scan is a left kidney stone.  - Attenuation CT scan shows post coronary stent findings  - There are multiple right lung nodules previously noted and visualized better on recent dedicated chest CT done on 12/30/14. If cancer risk factors present then a follow up chest CT in 12 months is recommended.     Echo Mar 2017 ?? Left ventricular hypertrophy - mild  ?? Moderately decreased left ventricular systolic function, EF 40%  ?? Dilated left atrium - moderate  ?? Aortic sclerosis  ?? Normal right ventricular systolic function  ?? Dilated right atrium - mild  ?? Limited study to evaluate ventricular function     Nuclear stress test Aug 2019 Impressions:  - Probably normal myocardial perfusion study  - No significant ischemia is noted on perfusion imaging  - There is a very small in size, subtle in severity, fixed defect involving the apical inferior segment. This is consistent with probable artifact (motion artifact, subdiaphragmatic activity and misregistration artifact) but cannot rule out subtle scar.  - Attenuation CT scan shows post PCI findings  - Also noted on attenuation CT is volume loss of the left hemithorax compatible with prior left upper lobectomy, emphysema, and scarring/atelectasis of the right lung base. The bilateral pulmonary nodules noted on CT chest dated 02/06/2018 are poorly characterized on this study   Echo Jan 2018 ?? Left ventricular hypertrophy - mild  ?? Normal left ventricular systolic function, ejection fraction > 55%  ?? Degenerative mitral valve disease  ?? Mitral regurgitation - mild  ?? Dilated left atrium - severe  ?? Aortic sclerosis  ?? Normal right ventricular systolic function  ?? Dilated right atrium - moderate  ?? Limited study to evaluate ventricular function     Stress Test (SPECT) Jan 2021 Impressions:  - No evidence for significant ischemia or scar is noted.  - Post stress: Global systolic function is mildly reduced. The ejection fraction calculated at 45%.   - Attenuation CT scan shows post PCI findings  - s/p Left upper lobectomy. Several right lung nodules (3:5, 3:12) overall unchanged in size compared to dedicated CT Chest dated 04/03/2019.  Past Medical History  Past Medical History:   Diagnosis Date   ??? Allergic    ??? Bladder polyps    ??? CAD (coronary artery disease) 09/2012    s/p BMS to 70% LCx   ??? CHF (congestive heart failure), NYHA class I (CMS-HCC)     EF 40-45% in 09/2012 on cath   ??? 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 CKD (chronic kidney disease) stage 2, GFR 60-89 ml/min     baseline creatinine 1.3   ??? Dysplastic nevi    ??? Heart disease    ??? Hepatitis B antibody positive    ??? HTN (hypertension)    ??? Liver disease    ??? Lung cancer (CMS-HCC)    ??? Mild cognitive impairment, so stated 11/11/2017   ??? Paroxysmal A-fib (CMS-HCC)     with RVR episodes in 2010/2014   ??? PVD (peripheral vascular disease) (CMS-HCC)    ??? 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 Type 2 diabetes mellitus without complication, without long-term current use of insulin (CMS-HCC) 02/02/2021     Past Surgical History  Past Surgical History:   Procedure Laterality Date   ??? bladder polyp removal     ??? CARDIAC CATHETERIZATION     ??? CORONARY STENT PLACEMENT     ??? PR BRONCHOSCOPY,PLACEMENT FIDUCIAL MARKERS, 1/MULT N/A 11/27/2013    Procedure: BRONCHOSCOPY, RIGID OR FLEXIBLE, FLOURO WHEN PERFORMED; PLACEMENT OF FIDUCIAL MARKERS, SINGLE OR MULTIPLE;  Surgeon: Cyndy Freeze, MD;  Location: MAIN OR [facility];  Service: Cardiothoracic   ??? PR THORACOSCOPY W/DX WEDGE RESEXN ANATO LUNG RESEXN Left 11/27/2013    Procedure: THORACOSCOPY, SURGICAL; WITH DIAGNOSTIC WEDGE RESECTION FOLLOWED BY ANATOMIC LUNG RESECTION;  Surgeon: Cyndy Freeze, MD;  Location: MAIN OR [facility];  Service: Cardiothoracic     Medications  Current Outpatient Medications   Medication Sig Dispense Refill   ??? albuterol HFA 90 mcg/actuation inhaler Inhale 2 puffs every six (6) hours as needed for wheezing. 8.5 g 1   ??? aspirin (ECOTRIN) 81 MG tablet Take 1 tablet (81 mg total) by mouth daily. Frequency:QD   Dosage:81   MG  Instructions:  Note:Dose: 81MG  30 tablet 0   ??? colchicine (COLCRYS) 0.6 mg tablet TAKE 1 TABLET (0.6 MG TOTAL) BY MOUTH AS NEEDED IN THE MORNING. 30 tablet 1   ??? digoxin (LANOXIN) 125 mcg (0.125 mg) tablet Take 1 tablet (125 mcg total) by mouth in the morning. 90 tablet 1   ??? ELIQUIS 5 mg Tab TAKE 1 TABLET (5 MG TOTAL) BY MOUTH TWO (2) TIMES A DAY. (Patient taking differently: Patient taking once daily) 60 tablet 10   ??? ezetimibe (ZETIA) 10 mg tablet Take 1 tablet (10 mg total) by mouth in the morning. 90 tablet 3   ??? finasteride (PROSCAR) 5 mg tablet Take 1 tablet (5 mg total) by mouth in the morning. 90 tablet 3   ??? fluticasone propionate (FLONASE) 50 mcg/actuation nasal spray 2 sprays into each nostril daily. 16 g 0   ??? losartan (COZAAR) 100 MG tablet Take 0.5 tablets (50 mg total) by mouth in the morning. For high blood pressure. 30 tablet 11   ??? metoprolol succinate (TOPROL XL) 50 MG 24 hr tablet Take 50mg  in AM and 25 mg in PM (Patient taking differently: Take 50mg  in AM and 50 mg in PM) 90 tablet 3   ??? metoprolol succinate (TOPROL-XL) 25 MG 24 hr tablet      ???  multivit-min/folic/vit K/lycop (ONE-A-DAY MEN'S MULTIVITAMIN ORAL) Take 1 tablet by mouth daily.     ??? neomycin-polymyxin-dexamethasone (MAXITROL) 3.5mg /mL-10,000 unit/mL-0.1 % ophthalmic suspension INSTILL 1 DROP INTO BOTH EYES 4 TIMES A DAY FOR 7 DAYS     ??? tadalafiL 20 MG tablet Take 1 tablet (20 mg total) by mouth as needed in the morning for erectile dysfunction. 30 tablet 6   ??? tobramycin (TOBREX) 0.3 % ophthalmic solution Administer 1 drop to both eyes daily.        Current Facility-Administered Medications   Medication Dose Route Frequency Provider Last Rate Last Admin   ??? lidocaine 2% gel (XYLOCAINE) jelly urojet 20 mL  20 mL Urethral Once Vickii Penna, MD       ??? sodium chloride irrigation (NS) 0.9 % irrigation solution   Irrigation Once Vickii Penna, MD         Allergies  Allergies   Allergen Reactions   ??? Atorvastatin Other (See Comments)     Leg pain, upper arm muscle weakness and sensation of atrophy.   ??? Pravastatin Other (See Comments)     hematuria     Social History  He  reports that he quit smoking about 12 years ago. His smoking use included cigarettes. He has a 27.50 pack-year smoking history. He has never used smokeless tobacco. He reports that he does not drink alcohol and does not use drugs.    Family History  His family history includes Diabetes in his mother; Diabetes type II in an other family member; Heart disease in his mother; Prostate cancer in his father.    Review of Systems  10 systems were reviewed and negative except as noted in HPI.      Objective:      Vitals  BP 141/103 (BP Site: L Arm, BP Position: Sitting, BP Cuff Size: Medium)  - Pulse 86  - Ht 177.8 cm (5' 10)  - Wt (!) 102.4 kg (225 lb 12.8 oz)  - SpO2 96%  - BMI 32.40 kg/m??      Wt Readings from Last 3 Encounters:   03/31/21 (!) 102.4 kg (225 lb 12.8 oz)   02/16/21 (!) 101.2 kg (223 lb)   02/02/21 (!) 103.1 kg (227 lb 6.4 oz)     Physical Exam  General:  Alert, no distress.   Neck: Supple, JVP normal.   Lungs:   CTAB bilaterally with normal WOB.   Heart:  Normal rate and irregular rhythm, no significant murmur, no rubs or gallops   Extremities: Warm well-perfused bilaterally. No edema bilaterally.     Most Recent Labs   Lab Results   Component Value Date    NA 137 11/23/2020    K 4.3 11/23/2020    CL 104 11/23/2020    CO2 26.0 11/23/2020     Lab Results   Component Value Date    BUN 19 11/23/2020    BUN 14 01/07/2020    BUN 15 10/17/2014    BUN 15 12/03/2013     Lab Results   Component Value Date    Creatinine Whole Blood, POC 1.4 04/25/2017    Creatinine 1.12 (H) 11/23/2020    Creatinine 1.14 01/07/2020    Creatinine 1.31 (H) 10/17/2014    Creatinine 1.18 12/03/2013     Lab Results   Component Value Date    PRO-BNP 796.0 (H) 11/23/2020    PRO-BNP 1,070.0 (H) 08/30/2016    PRO-BNP 1,850 (H) 05/21/2013    PRO-BNP 1,810 (H) 11/27/2012  Lab Results   Component Value Date    Cholesterol 151 09/10/2020    Cholesterol, Total 136 10/09/2012    Triglycerides 97 09/10/2020    HDL 36 (L) 09/10/2020    HDL 27 (L) 10/09/2012    Non-HDL Cholesterol 115 09/10/2020    LDL Calculated 96 09/10/2020    LDL Direct 82 06/03/2014

## 2021-04-01 DIAGNOSIS — M109 Gout, unspecified: Principal | ICD-10-CM

## 2021-04-01 MED ORDER — REPATHA SURECLICK 140 MG/ML SUBCUTANEOUS PEN INJECTOR
SUBCUTANEOUS | 23 refills | 0.00000 days | Status: CN
Start: 2021-04-01 — End: 2022-04-01

## 2021-04-01 MED ORDER — COLCHICINE 0.6 MG TABLET
ORAL_TABLET | Freq: Every day | ORAL | 1 refills | 90.00000 days | Status: CP | PRN
Start: 2021-04-01 — End: ?

## 2021-04-01 NOTE — Unmapped (Signed)
Addended by: Glade Stanford on: 04/01/2021 04:57 PM     Modules accepted: Orders

## 2021-04-01 NOTE — Unmapped (Signed)
Refill request received for patient.      Medication Requested: colchine  Last Office Visit: 03/31/2021   Next Office Visit: Visit date not found  Last Prescriber: Austin Miles    Nurse refill requirements met? Yes  If not met, why: n/a    Sent to: Provider for signing  If sent to provider, which provider?: [facility]

## 2021-04-08 DIAGNOSIS — E785 Hyperlipidemia, unspecified: Principal | ICD-10-CM

## 2021-04-08 NOTE — Unmapped (Signed)
Hi,     [name] contacted the [employer] requesting to speak with the care team of Ryan Murray to discuss:    Requesting for patient to be scheduled with Dr. Jacqulyn Bath or another NP.     Please contact Erik Burkett at [phone].      Thank you,   Yolanda Bonine  [facility]   [phone]

## 2021-04-09 MED ORDER — REPATHA SURECLICK 140 MG/ML SUBCUTANEOUS PEN INJECTOR
SUBCUTANEOUS | 5 refills | 0.00000 days | Status: CP
Start: 2021-04-09 — End: ?

## 2021-04-09 NOTE — Unmapped (Signed)
I returned a call to pt's daughter, Julian Hy who left a message with the answering service stating she missed a callback from [place] She stated she needed to schedule an appointment for pt with CT scan. She would like a callback after 1p if possible. I advised her that I would send Colin Mulders a message. She verbalized understanding. An inbasket message was sent.     Regina Eck RN  Nurse Coordinator-Cardiothoracic Surgery  Phone: [phone]  Pager: [phone]

## 2021-04-12 DIAGNOSIS — C3492 Malignant neoplasm of unspecified part of left bronchus or lung: Principal | ICD-10-CM

## 2021-04-12 NOTE — Unmapped (Signed)
Returned call to discuss 1 yr follow up with CT chest for surveillance of Stage Ia (pT1aN0M0) lung adenocarcinoma s/p thoracoscopic??left upper lobectomy on November 27, 2013.  She is willing to schedule an appointment on a Friday Morning.  I will inform Tobias Alexander, SRT scheduler.

## 2021-04-23 ENCOUNTER — Ambulatory Visit: Admit: 2021-04-23 | Discharge: 2021-04-24 | Payer: MEDICARE | Attending: Adult Health | Primary: Adult Health

## 2021-04-23 ENCOUNTER — Ambulatory Visit: Admit: 2021-04-23 | Discharge: 2021-04-24 | Payer: MEDICARE

## 2021-04-23 DIAGNOSIS — C3492 Malignant neoplasm of unspecified part of left bronchus or lung: Principal | ICD-10-CM

## 2021-04-23 NOTE — Unmapped (Signed)
Thoracic Surgery Clinic Follow-up Note      Assessment/Plan:     Ryan Murray is a 75 y.o. male who is status post thoracoscopic left upper lobectomy on November 27, 2013 for Stage IA (pT1a pN0 M0) lung adenocarcinoma s/p thoracoscopic left upper lobectomy on November 27, 2013 and returns for surveillance.  Today's CT chest demonstrates stable known nodules.  He will return to Thoracic Surgery 1 year with CT chest     Plan:  ?? Return to Thoracic Surgery in 1 year with CT chest     Subjective     Ryan Murray is a 75 y.o. male who is status post thoracoscopic left upper lobectomy on November 27, 2013 for Stage IA (pT1a pN0 M0) lung adenocarcinoma s/p thoracoscopic left upper lobectomy on November 27, 2013 and returns for surveillance.      Objective     Physical Exam:  Vitals:    04/23/21 1108   BP: 158/92   Pulse: 79   Resp: 18   Temp: 36.8 ??C (98.3 ??F)   SpO2: 97%     General: Well-developed, well-nourished male sitting in exam room in no acute distress.  HEENT: Normocephalic, sclera anicteric, EOMI, mucous membranes moist.  Neck: supple, trachea midline  Respiratory: Normal work of breathing on room air. No audible wheezes  CVS: Regular rate and rhythm. No edema.  GI: Soft, non-tender, non-distended  Ext: Warm and well-perfused   Neuro: Awake, alert and oriented x 3, no focal deficits  Psych: Speech clear and coherent. Mood and affect appropriate    Data Review:  Imaging: CT chest 9.2.22 personally reviewed  - Similar 9 x 6 mm nodule in the upper lobe of the right lung  - Anticipated changes associated with surgical resection of the upper lobe the left lung without significant imaging evidence of locally recurrent disease.

## 2021-04-29 DIAGNOSIS — I5022 Chronic systolic (congestive) heart failure: Principal | ICD-10-CM

## 2021-04-29 DIAGNOSIS — R399 Unspecified symptoms and signs involving the genitourinary system: Principal | ICD-10-CM

## 2021-04-29 DIAGNOSIS — I10 Essential (primary) hypertension: Principal | ICD-10-CM

## 2021-04-29 MED ORDER — FINASTERIDE 5 MG TABLET
ORAL_TABLET | Freq: Every day | ORAL | 3 refills | 90 days
Start: 2021-04-29 — End: ?

## 2021-04-29 MED ORDER — LOSARTAN 100 MG TABLET
ORAL_TABLET | Freq: Every day | ORAL | 11 refills | 60 days
Start: 2021-04-29 — End: 2022-04-29

## 2021-04-30 DIAGNOSIS — I5022 Chronic systolic (congestive) heart failure: Principal | ICD-10-CM

## 2021-04-30 DIAGNOSIS — I10 Essential (primary) hypertension: Principal | ICD-10-CM

## 2021-04-30 DIAGNOSIS — R399 Unspecified symptoms and signs involving the genitourinary system: Principal | ICD-10-CM

## 2021-04-30 MED ORDER — LOSARTAN 100 MG TABLET
ORAL_TABLET | Freq: Every day | ORAL | 2 refills | 120.00000 days | Status: CP
Start: 2021-04-30 — End: 2022-04-30

## 2021-04-30 MED ORDER — METOPROLOL SUCCINATE ER 50 MG TABLET,EXTENDED RELEASE 24 HR
ORAL_TABLET | 3 refills | 0 days | Status: CP
Start: 2021-04-30 — End: ?

## 2021-04-30 MED ORDER — FINASTERIDE 5 MG TABLET
ORAL_TABLET | Freq: Every day | ORAL | 3 refills | 90 days | Status: CP
Start: 2021-04-30 — End: ?

## 2021-05-24 NOTE — Unmapped (Signed)
Specialty Medication(s): Repatha    Ryan Murray has been dis-enrolled from the [facility] specialty pharmacy services due to patient declined onboarding.    Additional information provided to the patient: [facility]SC contact information    [name]  [facility] Specialty Pharmacist

## 2021-07-07 ENCOUNTER — Ambulatory Visit: Admit: 2021-07-07 | Discharge: 2021-07-08 | Payer: MEDICARE | Attending: Podiatrist | Primary: Podiatrist

## 2021-07-07 DIAGNOSIS — M79672 Pain in left foot: Principal | ICD-10-CM

## 2021-07-07 DIAGNOSIS — R2241 Localized swelling, mass and lump, right lower limb: Principal | ICD-10-CM

## 2021-07-07 DIAGNOSIS — M79671 Pain in right foot: Principal | ICD-10-CM

## 2021-07-07 DIAGNOSIS — R0989 Other specified symptoms and signs involving the circulatory and respiratory systems: Principal | ICD-10-CM

## 2021-07-07 DIAGNOSIS — Z87891 Personal history of nicotine dependence: Principal | ICD-10-CM

## 2021-07-08 ENCOUNTER — Ambulatory Visit: Admit: 2021-07-08 | Discharge: 2021-07-09 | Payer: MEDICARE

## 2021-07-20 ENCOUNTER — Ambulatory Visit: Admit: 2021-07-20 | Discharge: 2021-07-21 | Payer: MEDICARE

## 2021-07-20 MED ORDER — HYDRALAZINE 25 MG TABLET
ORAL_TABLET | Freq: Two times a day (BID) | ORAL | 1 refills | 30 days | Status: CP
Start: 2021-07-20 — End: 2022-07-20

## 2021-08-03 ENCOUNTER — Ambulatory Visit: Admit: 2021-08-03 | Payer: MEDICARE | Attending: Cardiovascular Disease | Primary: Cardiovascular Disease

## 2021-08-08 DIAGNOSIS — I4819 Other persistent atrial fibrillation: Principal | ICD-10-CM

## 2021-08-08 MED ORDER — DIGOXIN 125 MCG (0.125 MG) TABLET
ORAL_TABLET | Freq: Every day | ORAL | 1 refills | 0 days
Start: 2021-08-08 — End: ?

## 2021-08-09 MED ORDER — DIGOXIN 125 MCG (0.125 MG) TABLET
ORAL_TABLET | Freq: Every day | ORAL | 1 refills | 90 days | Status: CP
Start: 2021-08-09 — End: ?

## 2021-08-13 DIAGNOSIS — I152 Hypertension secondary to endocrine disorders: Principal | ICD-10-CM

## 2021-08-13 DIAGNOSIS — E1159 Type 2 diabetes mellitus with other circulatory complications: Principal | ICD-10-CM

## 2021-08-13 MED ORDER — HYDRALAZINE 25 MG TABLET
ORAL_TABLET | 3 refills | 0 days | Status: CP
Start: 2021-08-13 — End: ?

## 2021-08-17 ENCOUNTER — Ambulatory Visit: Admit: 2021-08-17 | Discharge: 2021-08-18 | Payer: MEDICARE

## 2021-09-07 ENCOUNTER — Ambulatory Visit: Admit: 2021-09-07 | Discharge: 2021-09-07 | Payer: MEDICARE

## 2021-09-07 ENCOUNTER — Ambulatory Visit
Admit: 2021-09-07 | Discharge: 2021-09-07 | Payer: MEDICARE | Attending: Cardiovascular Disease | Primary: Cardiovascular Disease

## 2021-09-07 DIAGNOSIS — E785 Hyperlipidemia, unspecified: Principal | ICD-10-CM

## 2021-09-07 DIAGNOSIS — N182 Chronic kidney disease, stage 2 (mild): Principal | ICD-10-CM

## 2021-09-07 DIAGNOSIS — I5022 Chronic systolic (congestive) heart failure: Principal | ICD-10-CM

## 2021-09-07 DIAGNOSIS — R0609 Other forms of dyspnea: Principal | ICD-10-CM

## 2021-09-07 DIAGNOSIS — E1159 Type 2 diabetes mellitus with other circulatory complications: Principal | ICD-10-CM

## 2021-09-07 DIAGNOSIS — I4819 Other persistent atrial fibrillation: Principal | ICD-10-CM

## 2021-09-07 DIAGNOSIS — I10 Essential (primary) hypertension: Principal | ICD-10-CM

## 2021-09-07 DIAGNOSIS — I152 Hypertension secondary to endocrine disorders: Principal | ICD-10-CM

## 2021-09-07 DIAGNOSIS — I25118 Atherosclerotic heart disease of native coronary artery with other forms of angina pectoris: Principal | ICD-10-CM

## 2021-09-07 MED ORDER — METOPROLOL SUCCINATE ER 100 MG TABLET,EXTENDED RELEASE 24 HR
ORAL_TABLET | Freq: Every day | ORAL | 3 refills | 90.00000 days | Status: CP
Start: 2021-09-07 — End: ?

## 2021-09-07 MED ORDER — LOSARTAN 100 MG TABLET
ORAL_TABLET | Freq: Every day | ORAL | 3 refills | 90.00000 days | Status: CP
Start: 2021-09-07 — End: 2022-09-07

## 2021-09-07 MED ORDER — APIXABAN 5 MG TABLET
ORAL_TABLET | Freq: Two times a day (BID) | ORAL | 3 refills | 90.00000 days | Status: CP
Start: 2021-09-07 — End: ?

## 2021-09-07 MED ORDER — DIGOXIN 125 MCG (0.125 MG) TABLET
ORAL_TABLET | Freq: Every day | ORAL | 1 refills | 90.00000 days | Status: CP
Start: 2021-09-07 — End: 2021-09-07

## 2021-09-20 ENCOUNTER — Ambulatory Visit: Admit: 2021-09-20 | Discharge: 2021-09-21 | Payer: MEDICARE

## 2021-09-20 DIAGNOSIS — I25118 Atherosclerotic heart disease of native coronary artery with other forms of angina pectoris: Principal | ICD-10-CM

## 2021-10-19 ENCOUNTER — Ambulatory Visit
Admit: 2021-10-19 | Discharge: 2021-10-19 | Payer: MEDICARE | Attending: Cardiovascular Disease | Primary: Cardiovascular Disease

## 2021-10-19 ENCOUNTER — Ambulatory Visit: Admit: 2021-10-19 | Discharge: 2021-10-19 | Payer: MEDICARE

## 2021-10-19 DIAGNOSIS — I5022 Chronic systolic (congestive) heart failure: Principal | ICD-10-CM

## 2021-10-19 DIAGNOSIS — I739 Peripheral vascular disease, unspecified: Principal | ICD-10-CM

## 2021-10-19 DIAGNOSIS — I152 Hypertension secondary to endocrine disorders: Principal | ICD-10-CM

## 2021-10-19 DIAGNOSIS — E1159 Type 2 diabetes mellitus with other circulatory complications: Principal | ICD-10-CM

## 2021-10-19 DIAGNOSIS — I4819 Other persistent atrial fibrillation: Principal | ICD-10-CM

## 2021-10-19 DIAGNOSIS — E119 Type 2 diabetes mellitus without complications: Principal | ICD-10-CM

## 2021-10-19 MED ORDER — SPIRONOLACTONE 25 MG TABLET
ORAL_TABLET | Freq: Every day | ORAL | 3 refills | 90 days | Status: CP
Start: 2021-10-19 — End: 2022-10-19

## 2022-01-18 ENCOUNTER — Ambulatory Visit
Admit: 2022-01-18 | Discharge: 2022-01-19 | Payer: MEDICARE | Attending: Cardiovascular Disease | Primary: Cardiovascular Disease

## 2022-01-18 DIAGNOSIS — I209 Angina pectoris, unspecified: Principal | ICD-10-CM

## 2022-01-18 MED ORDER — AMLODIPINE 5 MG TABLET
ORAL_TABLET | Freq: Every day | ORAL | 6 refills | 30 days | Status: CP
Start: 2022-01-18 — End: 2022-08-16

## 2022-01-18 MED ORDER — AMLODIPINE 10 MG TABLET
ORAL_TABLET | Freq: Every day | ORAL | 6 refills | 30 days | Status: CP
Start: 2022-01-18 — End: 2022-01-18

## 2022-02-28 ENCOUNTER — Ambulatory Visit: Admit: 2022-02-28 | Discharge: 2022-03-01 | Payer: MEDICARE

## 2022-03-21 DIAGNOSIS — I2510 Atherosclerotic heart disease of native coronary artery without angina pectoris: Principal | ICD-10-CM

## 2022-03-21 MED ORDER — EZETIMIBE 10 MG TABLET
ORAL_TABLET | Freq: Every day | ORAL | 3 refills | 90 days | Status: CP
Start: 2022-03-21 — End: ?

## 2022-03-30 DIAGNOSIS — R399 Unspecified symptoms and signs involving the genitourinary system: Principal | ICD-10-CM

## 2022-03-30 MED ORDER — FINASTERIDE 5 MG TABLET
ORAL_TABLET | Freq: Every day | ORAL | 2 refills | 90 days | Status: CP
Start: 2022-03-30 — End: ?

## 2022-04-19 DIAGNOSIS — C3490 Malignant neoplasm of unspecified part of unspecified bronchus or lung: Principal | ICD-10-CM

## 2022-04-29 ENCOUNTER — Ambulatory Visit: Admit: 2022-04-29 | Discharge: 2022-04-30 | Payer: MEDICARE

## 2022-05-01 DIAGNOSIS — I10 Essential (primary) hypertension: Principal | ICD-10-CM

## 2022-05-01 MED ORDER — METOPROLOL SUCCINATE ER 50 MG TABLET,EXTENDED RELEASE 24 HR
ORAL_TABLET | 3 refills | 0 days
Start: 2022-05-01 — End: ?

## 2022-05-02 MED ORDER — METOPROLOL SUCCINATE ER 50 MG TABLET,EXTENDED RELEASE 24 HR
ORAL_TABLET | 3 refills | 0 days
Start: 2022-05-02 — End: ?

## 2022-05-03 ENCOUNTER — Ambulatory Visit
Admit: 2022-05-03 | Discharge: 2022-05-04 | Payer: MEDICARE | Attending: Cardiovascular Disease | Primary: Cardiovascular Disease

## 2022-05-03 DIAGNOSIS — E785 Hyperlipidemia, unspecified: Principal | ICD-10-CM

## 2022-05-03 DIAGNOSIS — E782 Mixed hyperlipidemia: Principal | ICD-10-CM

## 2022-05-03 DIAGNOSIS — I5022 Chronic systolic (congestive) heart failure: Principal | ICD-10-CM

## 2022-05-03 DIAGNOSIS — I4819 Other persistent atrial fibrillation: Principal | ICD-10-CM

## 2022-05-03 DIAGNOSIS — I255 Ischemic cardiomyopathy: Principal | ICD-10-CM

## 2022-05-03 DIAGNOSIS — I2510 Atherosclerotic heart disease of native coronary artery without angina pectoris: Principal | ICD-10-CM

## 2022-06-27 ENCOUNTER — Ambulatory Visit: Admit: 2022-06-27 | Discharge: 2022-06-28 | Payer: MEDICARE

## 2022-08-16 ENCOUNTER — Ambulatory Visit: Admit: 2022-08-16 | Discharge: 2022-08-17 | Payer: MEDICARE

## 2022-08-17 ENCOUNTER — Ambulatory Visit: Admit: 2022-08-17 | Discharge: 2022-08-18 | Payer: MEDICARE

## 2022-08-23 ENCOUNTER — Ambulatory Visit: Admit: 2022-08-23 | Discharge: 2022-08-24 | Payer: MEDICARE

## 2022-08-25 ENCOUNTER — Institutional Professional Consult (permissible substitution): Admit: 2022-08-25 | Payer: MEDICARE

## 2022-09-26 ENCOUNTER — Ambulatory Visit: Admit: 2022-09-26 | Discharge: 2022-09-27 | Payer: MEDICARE

## 2022-10-11 ENCOUNTER — Ambulatory Visit: Admit: 2022-10-11 | Discharge: 2022-10-12 | Payer: MEDICARE | Attending: Adult Health | Primary: Adult Health

## 2022-10-11 ENCOUNTER — Ambulatory Visit: Admit: 2022-10-11 | Discharge: 2022-10-12 | Payer: MEDICARE

## 2022-10-13 MED ORDER — SPIRONOLACTONE 25 MG TABLET
ORAL_TABLET | Freq: Every day | ORAL | 3 refills | 90 days | Status: CP
Start: 2022-10-13 — End: 2023-10-13

## 2022-10-18 DIAGNOSIS — R911 Solitary pulmonary nodule: Principal | ICD-10-CM

## 2022-11-01 ENCOUNTER — Institutional Professional Consult (permissible substitution): Admit: 2022-11-01 | Discharge: 2022-11-02 | Payer: MEDICARE

## 2022-11-08 ENCOUNTER — Ambulatory Visit: Admit: 2022-11-08 | Discharge: 2022-11-08 | Payer: MEDICARE | Attending: Pulmonary Disease | Primary: Pulmonary Disease

## 2022-11-08 ENCOUNTER — Ambulatory Visit: Admit: 2022-11-08 | Discharge: 2022-11-08 | Payer: MEDICARE

## 2022-11-08 DIAGNOSIS — R911 Solitary pulmonary nodule: Principal | ICD-10-CM

## 2022-11-08 MED ORDER — NITROFURANTOIN MONOHYDRATE/MACROCRYSTALS 100 MG CAPSULE
ORAL_CAPSULE | Freq: Two times a day (BID) | ORAL | 0 refills | 7 days | Status: CP
Start: 2022-11-08 — End: 2022-11-15

## 2022-11-15 ENCOUNTER — Ambulatory Visit: Admit: 2022-11-15 | Discharge: 2022-11-16 | Payer: MEDICARE

## 2022-11-23 ENCOUNTER — Encounter: Admit: 2022-11-23 | Discharge: 2022-11-23 | Payer: MEDICARE | Attending: Anesthesiology | Primary: Anesthesiology

## 2022-11-23 ENCOUNTER — Ambulatory Visit: Admit: 2022-11-23 | Discharge: 2022-11-23 | Payer: MEDICARE

## 2022-11-23 DIAGNOSIS — I5022 Chronic systolic (congestive) heart failure: Principal | ICD-10-CM

## 2022-11-23 DIAGNOSIS — I10 Essential (primary) hypertension: Principal | ICD-10-CM

## 2022-11-23 DIAGNOSIS — I4819 Other persistent atrial fibrillation: Principal | ICD-10-CM

## 2022-11-23 MED ORDER — DIGOXIN 125 MCG (0.125 MG) TABLET
ORAL_TABLET | Freq: Every day | ORAL | 3 refills | 90 days | Status: CP
Start: 2022-11-23 — End: ?

## 2022-11-23 MED ORDER — LOSARTAN 100 MG TABLET
ORAL_TABLET | Freq: Every day | ORAL | 3 refills | 90 days | Status: CP
Start: 2022-11-23 — End: 2023-11-23

## 2022-11-23 MED ORDER — APIXABAN 5 MG TABLET
ORAL_TABLET | Freq: Two times a day (BID) | ORAL | 3 refills | 90 days | Status: CP
Start: 2022-11-23 — End: ?

## 2022-11-28 ENCOUNTER — Institutional Professional Consult (permissible substitution): Admit: 2022-11-28 | Discharge: 2022-11-29 | Payer: MEDICARE

## 2023-01-25 DIAGNOSIS — R911 Solitary pulmonary nodule: Principal | ICD-10-CM

## 2023-01-30 ENCOUNTER — Ambulatory Visit: Admit: 2023-01-30 | Discharge: 2023-01-31 | Payer: MEDICARE

## 2023-01-30 MED ORDER — AMLODIPINE 5 MG TABLET
ORAL_TABLET | Freq: Every day | ORAL | 6 refills | 30 days | Status: CP
Start: 2023-01-30 — End: 2023-08-28

## 2023-01-30 MED ORDER — ALBUTEROL SULFATE HFA 90 MCG/ACTUATION AEROSOL INHALER
Freq: Four times a day (QID) | RESPIRATORY_TRACT | 1 refills | 0 days | Status: CP | PRN
Start: 2023-01-30 — End: 2024-01-30

## 2023-02-02 ENCOUNTER — Ambulatory Visit: Admit: 2023-02-02 | Discharge: 2023-02-03 | Payer: MEDICARE

## 2023-02-22 ENCOUNTER — Ambulatory Visit: Admit: 2023-02-22 | Discharge: 2023-02-23 | Payer: MEDICARE

## 2023-02-22 MED ORDER — AMOXICILLIN 875 MG TABLET
ORAL_TABLET | Freq: Two times a day (BID) | ORAL | 0 refills | 7 days | Status: CP
Start: 2023-02-22 — End: 2023-03-01

## 2023-02-28 ENCOUNTER — Ambulatory Visit: Admit: 2023-02-28 | Discharge: 2023-03-01 | Payer: MEDICARE

## 2023-02-28 MED ORDER — NITROFURANTOIN MONOHYDRATE/MACROCRYSTALS 100 MG CAPSULE
ORAL_CAPSULE | Freq: Two times a day (BID) | ORAL | 0 refills | 7 days | Status: CP
Start: 2023-02-28 — End: 2023-03-07

## 2023-03-11 DIAGNOSIS — R911 Solitary pulmonary nodule: Principal | ICD-10-CM

## 2023-03-24 DIAGNOSIS — R399 Unspecified symptoms and signs involving the genitourinary system: Principal | ICD-10-CM

## 2023-03-24 MED ORDER — FINASTERIDE 5 MG TABLET
ORAL_TABLET | Freq: Every day | ORAL | 2 refills | 0 days
Start: 2023-03-24 — End: ?

## 2023-03-27 MED ORDER — FINASTERIDE 5 MG TABLET
ORAL_TABLET | Freq: Every day | ORAL | 2 refills | 90 days | Status: CP
Start: 2023-03-27 — End: ?

## 2023-04-17 ENCOUNTER — Ambulatory Visit: Admit: 2023-04-17 | Discharge: 2023-04-18 | Payer: MEDICARE

## 2023-04-17 MED ORDER — AYR SALINE 0.65 % NASAL DROPS
NASAL | 0 refills | 0 days | Status: CP | PRN
Start: 2023-04-17 — End: ?

## 2023-04-17 MED ORDER — FLUTICASONE PROPIONATE 50 MCG/ACTUATION NASAL SPRAY,SUSPENSION
Freq: Every day | NASAL | 0 refills | 120 days | Status: CP
Start: 2023-04-17 — End: 2024-04-16

## 2023-04-17 MED ORDER — AMOXICILLIN 875 MG-POTASSIUM CLAVULANATE 125 MG TABLET
ORAL_TABLET | Freq: Two times a day (BID) | ORAL | 1 refills | 7 days | Status: CP
Start: 2023-04-17 — End: 2023-04-24

## 2023-04-26 DIAGNOSIS — I10 Essential (primary) hypertension: Principal | ICD-10-CM

## 2023-04-26 MED ORDER — METOPROLOL SUCCINATE ER 100 MG TABLET,EXTENDED RELEASE 24 HR
ORAL_TABLET | Freq: Every day | ORAL | 0 refills | 90 days | Status: CP
Start: 2023-04-26 — End: ?

## 2023-05-01 ENCOUNTER — Ambulatory Visit: Admit: 2023-05-01 | Discharge: 2023-05-02 | Payer: MEDICARE | Attending: Urology | Primary: Urology

## 2023-05-01 DIAGNOSIS — N400 Enlarged prostate without lower urinary tract symptoms: Principal | ICD-10-CM

## 2023-05-01 MED ORDER — TAMSULOSIN 0.4 MG CAPSULE
ORAL_CAPSULE | Freq: Every day | ORAL | 3 refills | 90 days | Status: CP
Start: 2023-05-01 — End: 2024-04-30

## 2023-05-03 MED ORDER — NITROFURANTOIN MONOHYDRATE/MACROCRYSTALS 100 MG CAPSULE
ORAL_CAPSULE | Freq: Two times a day (BID) | ORAL | 0 refills | 10 days | Status: CP
Start: 2023-05-03 — End: 2023-05-13

## 2023-06-02 ENCOUNTER — Ambulatory Visit: Admit: 2023-06-02 | Discharge: 2023-06-03

## 2023-06-02 MED ORDER — LIDOCAINE 5 % TOPICAL PATCH
MEDICATED_PATCH | Freq: Two times a day (BID) | TRANSDERMAL | 0 refills | 5 days | Status: CP
Start: 2023-06-02 — End: 2024-06-01

## 2023-06-08 DIAGNOSIS — J0110 Acute frontal sinusitis, unspecified: Principal | ICD-10-CM

## 2023-06-08 MED ORDER — FLUTICASONE PROPIONATE 50 MCG/ACTUATION NASAL SPRAY,SUSPENSION
1 refills | 0 days
Start: 2023-06-08 — End: ?

## 2023-07-10 ENCOUNTER — Ambulatory Visit: Admit: 2023-07-10 | Discharge: 2023-07-11

## 2023-07-10 MED ORDER — ALBUTEROL SULFATE HFA 90 MCG/ACTUATION AEROSOL INHALER
Freq: Four times a day (QID) | RESPIRATORY_TRACT | 1 refills | 0 days | Status: CP | PRN
Start: 2023-07-10 — End: 2024-07-09

## 2023-07-10 MED ORDER — DOXYCYCLINE HYCLATE 100 MG TABLET
ORAL_TABLET | Freq: Two times a day (BID) | ORAL | 0 refills | 5 days | Status: CP
Start: 2023-07-10 — End: 2023-07-15

## 2023-07-10 MED ORDER — BUDESONIDE-FORMOTEROL HFA 80 MCG-4.5 MCG/ACTUATION AEROSOL INHALER
Freq: Two times a day (BID) | RESPIRATORY_TRACT | 0 refills | 21 days | Status: CP
Start: 2023-07-10 — End: 2024-07-09

## 2023-07-29 DIAGNOSIS — E1159 Type 2 diabetes mellitus with other circulatory complications: Principal | ICD-10-CM

## 2023-07-29 DIAGNOSIS — I152 Hypertension secondary to endocrine disorders: Principal | ICD-10-CM

## 2023-07-29 MED ORDER — AMLODIPINE 5 MG TABLET
ORAL_TABLET | Freq: Every day | ORAL | 2 refills | 0 days
Start: 2023-07-29 — End: ?

## 2023-07-31 DIAGNOSIS — I10 Essential (primary) hypertension: Principal | ICD-10-CM

## 2023-07-31 MED ORDER — AMLODIPINE 5 MG TABLET
ORAL_TABLET | Freq: Every day | ORAL | 1 refills | 90 days | Status: CP
Start: 2023-07-31 — End: 2024-02-26

## 2023-07-31 MED ORDER — METOPROLOL SUCCINATE ER 100 MG TABLET,EXTENDED RELEASE 24 HR
ORAL_TABLET | Freq: Every day | ORAL | 0 refills | 60.00 days | Status: CP
Start: 2023-07-31 — End: ?

## 2023-08-02 ENCOUNTER — Ambulatory Visit: Admit: 2023-08-02 | Discharge: 2023-08-03

## 2023-08-02 MED ORDER — AMOXICILLIN 875 MG-POTASSIUM CLAVULANATE 125 MG TABLET
ORAL_TABLET | Freq: Two times a day (BID) | ORAL | 1 refills | 7.00 days | Status: CP
Start: 2023-08-02 — End: 2023-08-09

## 2023-08-02 MED ORDER — AYR SALINE 0.65 % NASAL DROPS
NASAL | 0 refills | 0.00 days | Status: CP | PRN
Start: 2023-08-02 — End: ?

## 2023-08-02 MED ORDER — FLUTICASONE PROPIONATE 50 MCG/ACTUATION NASAL SPRAY,SUSPENSION
Freq: Every day | NASAL | 0 refills | 120.00 days | Status: CP
Start: 2023-08-02 — End: 2024-08-01

## 2023-08-22 ENCOUNTER — Ambulatory Visit
Admission: EM | Admit: 2023-08-22 | Discharge: 2023-08-22 | Disposition: A | Discharge status: Home / Self Care | Payer: Medicare HMO | Attending: Emergency Medicine | Admitting: Emergency Medicine

## 2023-08-22 ENCOUNTER — Ambulatory Visit (INDEPENDENT_AMBULATORY_CARE_PROVIDER_SITE_OTHER): Payer: Medicare HMO

## 2023-08-22 DIAGNOSIS — J441 Chronic obstructive pulmonary disease with (acute) exacerbation: Secondary | ICD-10-CM

## 2023-08-22 HISTORY — DX: Chronic obstructive pulmonary disease, unspecified: J44.9

## 2023-08-22 MED ORDER — IPRATROPIUM-ALBUTEROL 0.5-2.5 (3) MG/3ML IN SOLN
3.0000 mL | Freq: Once | RESPIRATORY_TRACT | Status: AC
  Administered 2023-08-22: 3 mL via RESPIRATORY_TRACT

## 2023-08-22 MED ORDER — PREDNISONE 20 MG PO TABS
40.0000 mg | ORAL_TABLET | Freq: Once | ORAL | Status: AC
  Administered 2023-08-22: 40 mg via ORAL

## 2023-08-22 MED ORDER — ALBUTEROL SULFATE HFA 108 (90 BASE) MCG/ACT IN AERS: 1.0000 | INHALATION_SPRAY | RESPIRATORY_TRACT | 0 refills | Status: AC | PRN

## 2023-08-22 MED ORDER — PREDNISONE 20 MG PO TABS: 40.0000 mg | ORAL_TABLET | Freq: Every day | ORAL | 0 refills | Status: AC

## 2023-08-22 MED ORDER — AEROCHAMBER MV MISC: 1 refills | Status: AC

## 2023-08-22 NOTE — ED Triage Notes (Signed)
Sx x 21 days  Productive cough  No fever   Patient was on 2 rounds of abx since sx started hx of copd

## 2023-08-22 NOTE — Discharge Instructions (Signed)
 Take two puffs from your albuterol  inhaler every 4 hours for 2 days, then every 6 hours for 2 days, then as needed. You can back off if you start to improve  sooner. Finish the steroids unless your doctor tells you to stop. You may start the steroids tomorrow, as you have already taken today's dose.  Make sure you drink extra fluids.    If the spacer is too expensive at the pharmacy, you can get an AeroChamber Z-Stat off of [employer] for about $10-$15.  We will contact you if the radiology overread differs enough from mine and we need to change management.  Go to www.goodrx.com  or www.costplusdrugs.com to look up your medications. This will give you a list of where you can find your prescriptions at the most affordable prices. Or ask the pharmacist what the cash price is, or if they have any other discount programs available to help make your medication more affordable. This can be less expensive than what you would pay with insurance.

## 2023-08-22 NOTE — ED Provider Notes (Signed)
 HPI  SUBJECTIVE:  Drew Walker is a 77 y.o. male who presents with 3 weeks of chest congestion, cough productive of an increased amount white nonpurulent sputum, wheezing, intermittent shortness of breath.  No change above his baseline dyspnea on exertion.  States his symptoms started off as a sinus infection, but this has resolved with Augmentin.  No fevers, chest pain.  He is able to sleep at night without waking up coughing.  He finished 7 days of Augmentin 5 days ago with improvement in his symptoms.  He has been using his albuterol  as needed, up to 2 or 3 times a day and Tylenol with improvement in his symptoms.  No aggravating factors.  He states that overall he feels great.  Patient has a past medical history of COPD, hyperlipidemia, hypertension, paroxysmal atrial fibrillation on Eliquis, gout, lung cancer status post left lower lobe removal in 2014, and pneumonia x 3.  He has never been admitted for his COPD.  No recent steroid use.  No history of diabetes, chronic kidney disease.  PCP: [facility].  Patient was seen by his PCP on 11/18, found to have a COPD exacerbation.  Sent home with Symbicort, albuterol , doxycycline for 5 days.  Returned to PCP on 12/11 for persistent cough, headache, nasal congestion, shortness of breath, wheezing, found to have acute maxillary sinusitis and was sent home with 7 days of Augmentin, Flonase.   Past Medical History:  Diagnosis Date   COPD (chronic obstructive pulmonary disease) (HCC)    Hyperlipidemia    Hypertension    Paroxysmal atrial fibrillation (HCC)     History reviewed. No pertinent surgical history.  History reviewed. No pertinent family history.  Social History   Tobacco Use   Smoking status: Former   Smokeless tobacco: Never  Substance Use Topics   Alcohol use: No   Drug use: No     Current Facility-Administered Medications:    betamethasone  acetate-betamethasone  sodium phosphate (CELESTONE ) injection 3 mg, 3 mg,  Intramuscular, Once, Janit Thresa HERO, DPM  Current Outpatient Medications:    albuterol  (VENTOLIN  HFA) 108 (90 Base) MCG/ACT inhaler, Inhale 1-2 puffs into the lungs every 4 (four) hours as needed for wheezing or shortness of breath., Disp: 1 each, Rfl: 0   amLODipine (NORVASC) 5 MG tablet, Take 1 tablet by mouth daily., Disp: , Rfl:    aspirin EC 81 MG tablet, Take 81 mg by mouth., Disp: , Rfl:    budesonide-formoterol (SYMBICORT) 80-4.5 MCG/ACT inhaler, Inhale into the lungs., Disp: , Rfl:    colchicine 0.6 MG tablet, Take 2 tablets x 1, then 1 tablet 1 hr later. Then take 1 tablet daily for 7 days, Disp: , Rfl:    ELIQUIS 5 MG TABS tablet, , Disp: , Rfl:    ezetimibe (ZETIA) 10 MG tablet, Take by mouth., Disp: , Rfl:    ferrous sulfate 325 (65 FE) MG EC tablet, Take 325 mg by mouth., Disp: , Rfl:    hydrochlorothiazide (HYDRODIURIL) 12.5 MG tablet, , Disp: , Rfl:    isosorbide mononitrate (IMDUR) 30 MG 24 hr tablet, Take 30 mg by mouth daily., Disp: , Rfl:    metoprolol (LOPRESSOR) 100 MG tablet, Take 100 mg by mouth 2 (two) times daily., Disp: , Rfl:    predniSONE  (DELTASONE ) 20 MG tablet, Take 2 tablets (40 mg total) by mouth daily with breakfast for 5 days., Disp: 10 tablet, Rfl: 0   Spacer/Aero-Holding Chambers (AEROCHAMBER MV) inhaler, Use as instructed, Disp: 1 each, Rfl:  1   SPIRIVA HANDIHALER 18 MCG inhalation capsule, , Disp: , Rfl:    valsartan (DIOVAN) 160 MG tablet, Take 80 mg by mouth., Disp: , Rfl:    albuterol  (PROAIR  HFA) 108 (90 Base) MCG/ACT inhaler, Inhale into the lungs., Disp: , Rfl:    aspirin 81 MG tablet, Take 81 mg by mouth daily., Disp: , Rfl:    [employer] SALINE NOSE SPRAY 0.65 % nasal spray, INSTILL 2 DROPS INTO EACH NOSTRIL EVERY FOUR (4) HOURS AS NEEDED., Disp: , Rfl: 0   pravastatin (PRAVACHOL) 20 MG tablet, Take 20 mg by mouth., Disp: , Rfl:   Allergies  Allergen Reactions   Atorvastatin Other (See Comments)    Leg pain, upper arm muscle weakness and sensation of  atrophy.   Pravastatin Other (See Comments)    hematuria     ROS  As noted in HPI.   Physical Exam  BP 138/89 (BP Location: Left Arm)   Pulse 62   Temp 98 F (36.7 C) (Oral)   Resp 19   SpO2 96%   Constitutional: Well developed, well nourished, no acute distress Eyes:  EOMI, conjunctiva normal bilaterally HENT: Normocephalic, atraumatic,mucus membranes moist Respiratory: Normal inspiratory effort.  Poor air movement.  Diffuse expiratory wheezing throughout all lung fields.  No rales or rhonchi.  No anterior, lateral chest wall tenderness Cardiovascular: Normal rate, regular rhythm, no murmurs rubs or gallops. GI: nondistended skin: No rash, skin intact Musculoskeletal: no deformities Neurologic: Alert & oriented x 3, no focal neuro deficits Psychiatric: Speech and behavior appropriate   ED Course   Medications  ipratropium-albuterol  (DUONEB) 0.5-2.5 (3) MG/3ML nebulizer solution 3 mL (3 mLs Nebulization Given 08/22/23 1503)  predniSONE  (DELTASONE ) tablet 40 mg (40 mg Oral Given 08/22/23 1502)    Orders Placed This Encounter  Procedures   DG Chest 2 View    Standing Status:   Standing    Number of Occurrences:   1    Reason for Exam (SYMPTOM  OR DIAGNOSIS REQUIRED):   History of COPD, pneumonia, status post left lung resection. 3 weeks of wheezing rule out acute changes or pneumonia    No results found for this or any previous visit (from the past 24 hours). DG Chest 2 View Result Date: 08/22/2023 CLINICAL DATA:  History of COPD and pneumonia. Status post left lung resection. EXAM: CHEST - 2 VIEW COMPARISON:  10/01/2012 FINDINGS: Heart size and mediastinal contours are unremarkable. There is mild left lung volume loss consistent with the history of prior left lung resection. No signs of pleural effusion, interstitial edema, or airspace consolidation. Visualized osseous structures are unremarkable. IMPRESSION: 1. No acute cardiopulmonary abnormalities. 2. Mild left  lung volume loss consistent with the history of prior left lung resection. Electronically Signed   By: Waddell Calk M.D.   On: 08/22/2023 17:27    ED Clinical Impression  1. COPD exacerbation [facility])      ED Assessment/Plan    Patient presents with an acute exacerbation of a chronic illness.  Outside records reviewed.  As noted in HPI.  Patient states that the sinus symptoms have resolved, he is still having respiratory symptoms.  I suspect a persistent COPD exacerbation.  Will repeat x-ray to rule out new pneumonia.  Will give 40 mg of prednisone  here and a DuoNeb.  Will reevaluate.  Reviewed imaging independently.  No acute cardiopulmonary disease.  Formal radiology report pending.  Will contact patient if overread differs enough from mine and we need to change management.  Reviewed radiology report.  No pneumonia or acute cardiopulmonary disease consistent with my read.  See radiology report for full details.  Suspect mild COPD exacerbation.  Will send home with regularly scheduled albuterol  inhaler with a spacer for 4 days, then as needed there after, prednisone  40 mg for an additional 5 days.  Deferring antibiotic treatment at this time as he only has 1 out of 3 of the cardinal symptoms of COPD exacerbation, which is increasing sputum volume.  He denies change in his baseline shortness of breath or purulent sputum.  Reevaluation, patient states that he feels better.  Improved air movement, still with some mild expiratory wheezing.  He will follow-up with his PCP or return here if not better after finishing the prednisone  and we can consider antibiotics, he will go to the ER if he gets worse.  Discussed imaging, MDM, treatment plan, and plan for follow-up with patient. Discussed sn/sx that should prompt return to the ED. patient agrees with plan.   Meds ordered this encounter  Medications   ipratropium-albuterol  (DUONEB) 0.5-2.5 (3) MG/3ML nebulizer solution 3 mL   predniSONE   (DELTASONE ) tablet 40 mg   predniSONE  (DELTASONE ) 20 MG tablet    Sig: Take 2 tablets (40 mg total) by mouth daily with breakfast for 5 days.    Dispense:  10 tablet    Refill:  0   albuterol  (VENTOLIN  HFA) 108 (90 Base) MCG/ACT inhaler    Sig: Inhale 1-2 puffs into the lungs every 4 (four) hours as needed for wheezing or shortness of breath.    Dispense:  1 each    Refill:  0   Spacer/Aero-Holding Chambers (AEROCHAMBER MV) inhaler    Sig: Use as instructed    Dispense:  1 each    Refill:  1      *This clinic note was created using Dragon dictation software. Therefore, there may be occasional mistakes despite careful proofreading.  ?    Van Knee, MD 08/24/23 [phone]

## 2023-09-05 DIAGNOSIS — R911 Solitary pulmonary nodule: Principal | ICD-10-CM

## 2023-09-22 ENCOUNTER — Ambulatory Visit: Admit: 2023-09-22 | Discharge: 2023-09-23 | Payer: MEDICARE

## 2023-09-24 DIAGNOSIS — J0100 Acute maxillary sinusitis, unspecified: Principal | ICD-10-CM

## 2023-09-24 MED ORDER — FLUTICASONE PROPIONATE 50 MCG/ACTUATION NASAL SPRAY,SUSPENSION
1 refills | 0.00 days
Start: 2023-09-24 — End: ?

## 2023-09-26 ENCOUNTER — Ambulatory Visit: Admit: 2023-09-26 | Discharge: 2023-09-27 | Payer: MEDICARE | Attending: Family | Primary: Family

## 2023-09-26 DIAGNOSIS — N390 Urinary tract infection, site not specified: Principal | ICD-10-CM

## 2023-09-26 DIAGNOSIS — R350 Frequency of micturition: Principal | ICD-10-CM

## 2023-09-26 MED ORDER — FLUTICASONE PROPIONATE 50 MCG/ACTUATION NASAL SPRAY,SUSPENSION
1 refills | 0.00 days
Start: 2023-09-26 — End: ?

## 2023-09-26 MED ORDER — CIPROFLOXACIN 500 MG TABLET
ORAL_TABLET | Freq: Two times a day (BID) | ORAL | 0 refills | 7.00 days | Status: CP
Start: 2023-09-26 — End: 2023-10-03

## 2023-09-26 MED ORDER — SULFAMETHOXAZOLE 800 MG-TRIMETHOPRIM 160 MG TABLET
ORAL_TABLET | Freq: Two times a day (BID) | ORAL | 0 refills | 7.00 days | Status: CN
Start: 2023-09-26 — End: 2023-10-03

## 2023-09-28 DIAGNOSIS — N390 Urinary tract infection, site not specified: Principal | ICD-10-CM

## 2023-09-28 MED ORDER — NITROFURANTOIN MONOHYDRATE/MACROCRYSTALS 100 MG CAPSULE
ORAL_CAPSULE | Freq: Two times a day (BID) | ORAL | 0 refills | 7.00 days | Status: CP
Start: 2023-09-28 — End: 2023-10-05

## 2023-10-04 ENCOUNTER — Ambulatory Visit: Admit: 2023-10-04 | Payer: MEDICARE

## 2023-10-25 MED ORDER — SPIRONOLACTONE 25 MG TABLET
ORAL_TABLET | Freq: Every day | ORAL | 3 refills | 90.00 days | Status: CP
Start: 2023-10-25 — End: 2024-10-24

## 2023-11-06 ENCOUNTER — Ambulatory Visit: Admit: 2023-11-06 | Discharge: 2023-11-07 | Payer: MEDICARE

## 2023-11-06 MED ORDER — NITROFURANTOIN MONOHYDRATE/MACROCRYSTALS 100 MG CAPSULE
ORAL_CAPSULE | Freq: Two times a day (BID) | ORAL | 0 refills | 7.00 days | Status: CP
Start: 2023-11-06 — End: 2023-11-13

## 2023-11-13 ENCOUNTER — Ambulatory Visit: Admit: 2023-11-13 | Discharge: 2023-11-14 | Payer: MEDICARE | Attending: Urology | Primary: Urology

## 2023-11-13 DIAGNOSIS — N3001 Acute cystitis with hematuria: Principal | ICD-10-CM

## 2023-11-24 DIAGNOSIS — I5022 Chronic systolic (congestive) heart failure: Principal | ICD-10-CM

## 2023-11-24 DIAGNOSIS — I10 Essential (primary) hypertension: Principal | ICD-10-CM

## 2023-11-24 MED ORDER — LOSARTAN 100 MG TABLET
ORAL_TABLET | Freq: Every day | ORAL | 3 refills | 90 days | Status: CP
Start: 2023-11-24 — End: 2024-11-23

## 2023-12-08 DIAGNOSIS — I4819 Other persistent atrial fibrillation: Principal | ICD-10-CM

## 2023-12-08 MED ORDER — DIGOXIN 125 MCG (0.125 MG) TABLET
ORAL_TABLET | Freq: Every day | ORAL | 3 refills | 90.00 days
Start: 2023-12-08 — End: ?

## 2023-12-11 MED ORDER — DIGOXIN 125 MCG (0.125 MG) TABLET
ORAL_TABLET | Freq: Every day | ORAL | 3 refills | 90.00 days | Status: CP
Start: 2023-12-11 — End: ?

## 2023-12-12 ENCOUNTER — Ambulatory Visit
Admit: 2023-12-12 | Discharge: 2023-12-13 | Payer: Medicare (Managed Care) | Attending: Cardiovascular Disease | Primary: Cardiovascular Disease

## 2023-12-13 DIAGNOSIS — I10 Essential (primary) hypertension: Principal | ICD-10-CM

## 2023-12-13 MED ORDER — METOPROLOL SUCCINATE ER 100 MG TABLET,EXTENDED RELEASE 24 HR
ORAL_TABLET | Freq: Every day | ORAL | 1 refills | 0.00 days
Start: 2023-12-13 — End: ?

## 2023-12-14 MED ORDER — METOPROLOL SUCCINATE ER 100 MG TABLET,EXTENDED RELEASE 24 HR
ORAL_TABLET | Freq: Every day | ORAL | 3 refills | 90.00 days | Status: CP
Start: 2023-12-14 — End: ?

## 2023-12-18 ENCOUNTER — Ambulatory Visit: Admit: 2023-12-18 | Payer: Medicare (Managed Care)

## 2023-12-19 ENCOUNTER — Ambulatory Visit: Admit: 2023-12-19 | Discharge: 2023-12-20 | Payer: Medicare (Managed Care)

## 2023-12-25 ENCOUNTER — Inpatient Hospital Stay: Admit: 2023-12-25 | Discharge: 2023-12-26 | Payer: Medicare (Managed Care)

## 2023-12-26 ENCOUNTER — Ambulatory Visit: Admit: 2023-12-26 | Discharge: 2023-12-27 | Payer: Medicare (Managed Care)

## 2023-12-26 DIAGNOSIS — Z8551 Personal history of malignant neoplasm of bladder: Principal | ICD-10-CM

## 2023-12-26 DIAGNOSIS — N3001 Acute cystitis with hematuria: Principal | ICD-10-CM

## 2023-12-26 DIAGNOSIS — N400 Enlarged prostate without lower urinary tract symptoms: Principal | ICD-10-CM

## 2023-12-26 DIAGNOSIS — D494 Neoplasm of unspecified behavior of bladder: Principal | ICD-10-CM

## 2023-12-26 MED ORDER — NITROFURANTOIN MONOHYDRATE/MACROCRYSTALS 100 MG CAPSULE
ORAL_CAPSULE | Freq: Two times a day (BID) | ORAL | 0 refills | 10.00000 days | Status: CP
Start: 2023-12-26 — End: 2024-01-05

## 2023-12-29 ENCOUNTER — Inpatient Hospital Stay: Admit: 2023-12-29 | Discharge: 2023-12-30 | Payer: Medicare (Managed Care)

## 2023-12-29 DIAGNOSIS — E785 Hyperlipidemia, unspecified: Principal | ICD-10-CM

## 2023-12-29 DIAGNOSIS — I255 Ischemic cardiomyopathy: Principal | ICD-10-CM

## 2023-12-29 DIAGNOSIS — I4819 Other persistent atrial fibrillation: Principal | ICD-10-CM

## 2023-12-29 DIAGNOSIS — I2510 Atherosclerotic heart disease of native coronary artery without angina pectoris: Principal | ICD-10-CM

## 2024-01-29 DIAGNOSIS — E1159 Type 2 diabetes mellitus with other circulatory complications: Principal | ICD-10-CM

## 2024-01-29 DIAGNOSIS — I152 Hypertension secondary to endocrine disorders: Principal | ICD-10-CM

## 2024-01-29 MED ORDER — AMLODIPINE 5 MG TABLET
ORAL_TABLET | Freq: Every day | ORAL | 1 refills | 90.00000 days | Status: CP
Start: 2024-01-29 — End: 2024-08-26

## 2024-01-30 DIAGNOSIS — N3001 Acute cystitis with hematuria: Principal | ICD-10-CM

## 2024-01-30 MED ORDER — NITROFURANTOIN MONOHYDRATE/MACROCRYSTALS 100 MG CAPSULE
ORAL_CAPSULE | Freq: Two times a day (BID) | ORAL | 0 refills | 3.00000 days | Status: CP
Start: 2024-01-30 — End: 2024-02-02

## 2024-02-27 ENCOUNTER — Ambulatory Visit: Admit: 2024-02-27 | Payer: Medicare (Managed Care)

## 2024-03-12 ENCOUNTER — Ambulatory Visit
Admit: 2024-03-12 | Discharge: 2024-03-13 | Payer: Medicare (Managed Care) | Attending: Cardiovascular Disease | Primary: Cardiovascular Disease

## 2024-03-12 DIAGNOSIS — E782 Mixed hyperlipidemia: Principal | ICD-10-CM

## 2024-03-12 DIAGNOSIS — E119 Type 2 diabetes mellitus without complications: Principal | ICD-10-CM

## 2024-03-12 DIAGNOSIS — I25118 Atherosclerotic heart disease of native coronary artery with other forms of angina pectoris: Principal | ICD-10-CM

## 2024-03-12 DIAGNOSIS — I2510 Atherosclerotic heart disease of native coronary artery without angina pectoris: Principal | ICD-10-CM

## 2024-03-12 DIAGNOSIS — I4891 Unspecified atrial fibrillation: Principal | ICD-10-CM

## 2024-03-12 DIAGNOSIS — I10 Essential (primary) hypertension: Principal | ICD-10-CM

## 2024-03-12 DIAGNOSIS — I4819 Other persistent atrial fibrillation: Principal | ICD-10-CM

## 2024-03-12 DIAGNOSIS — I255 Ischemic cardiomyopathy: Principal | ICD-10-CM

## 2024-04-19 DIAGNOSIS — R399 Unspecified symptoms and signs involving the genitourinary system: Principal | ICD-10-CM

## 2024-04-19 MED ORDER — FINASTERIDE 5 MG TABLET
ORAL_TABLET | Freq: Every day | ORAL | 2 refills | 90.00000 days | Status: CP
Start: 2024-04-19 — End: ?

## 2024-04-29 DIAGNOSIS — I4819 Other persistent atrial fibrillation: Principal | ICD-10-CM

## 2024-04-29 MED ORDER — ELIQUIS 5 MG TABLET
ORAL_TABLET | Freq: Two times a day (BID) | ORAL | 3 refills | 0.00000 days
Start: 2024-04-29 — End: ?

## 2024-04-30 MED ORDER — ELIQUIS 5 MG TABLET
ORAL_TABLET | Freq: Two times a day (BID) | ORAL | 3 refills | 90.00000 days | Status: CP
Start: 2024-04-30 — End: ?

## 2024-08-02 DIAGNOSIS — I152 Hypertension secondary to endocrine disorders: Principal | ICD-10-CM

## 2024-08-02 DIAGNOSIS — E1159 Type 2 diabetes mellitus with other circulatory complications: Principal | ICD-10-CM

## 2024-08-02 MED ORDER — AMLODIPINE 5 MG TABLET
ORAL_TABLET | Freq: Every day | ORAL | 1 refills | 90.00000 days | Status: CP
Start: 2024-08-02 — End: ?

## 2024-08-08 DIAGNOSIS — J441 Chronic obstructive pulmonary disease with (acute) exacerbation: Principal | ICD-10-CM

## 2024-08-08 DIAGNOSIS — J431 Panlobular emphysema: Principal | ICD-10-CM

## 2024-08-08 MED ORDER — ALBUTEROL SULFATE HFA 90 MCG/ACTUATION AEROSOL INHALER
Freq: Four times a day (QID) | RESPIRATORY_TRACT | 1 refills | 0.00000 days | Status: CP | PRN
Start: 2024-08-08 — End: 2025-08-08

## 2024-08-08 MED ORDER — BUDESONIDE-FORMOTEROL HFA 80 MCG-4.5 MCG/ACTUATION AEROSOL INHALER
Freq: Two times a day (BID) | RESPIRATORY_TRACT | 0 refills | 21.00000 days | Status: CP
Start: 2024-08-08 — End: 2025-08-08

## 2024-09-05 DIAGNOSIS — J431 Panlobular emphysema: Principal | ICD-10-CM

## 2024-09-05 DIAGNOSIS — J441 Chronic obstructive pulmonary disease with (acute) exacerbation: Principal | ICD-10-CM

## 2024-09-05 MED ORDER — BUDESONIDE-FORMOTEROL HFA 80 MCG-4.5 MCG/ACTUATION AEROSOL INHALER
Freq: Two times a day (BID) | RESPIRATORY_TRACT | 1 refills | 21.00000 days | Status: CP
Start: 2024-09-05 — End: 2025-09-05

## 2024-09-06 ENCOUNTER — Inpatient Hospital Stay
Admission: EM | Admit: 2024-09-06 | Discharge: 2024-09-10 | Disposition: A | Discharge status: Home / Self Care | Payer: Medicare (Managed Care)

## 2024-09-06 ENCOUNTER — Ambulatory Visit
Admission: EM | Admit: 2024-09-06 | Discharge: 2024-09-10 | Disposition: A | Discharge status: Home / Self Care | Payer: Medicare (Managed Care)

## 2024-09-08 MED ORDER — AMOXICILLIN 875 MG-POTASSIUM CLAVULANATE 125 MG TABLET
ORAL_TABLET | Freq: Two times a day (BID) | ORAL | 0 refills | 5.00000 days | Status: CN
Start: 2024-09-08 — End: 2024-09-13

## 2024-09-08 MED ORDER — FERROUS SULFATE 325 MG (65 MG IRON) TABLET
ORAL_TABLET | ORAL | 0 refills | 30.00000 days | Status: CP
Start: 2024-09-08 — End: 2025-09-08

## 2024-09-08 MED ORDER — PHENAZOPYRIDINE 200 MG TABLET
ORAL_TABLET | Freq: Three times a day (TID) | ORAL | 0 refills | 2.00000 days | Status: CP
Start: 2024-09-08 — End: 2024-09-10

## 2024-09-10 MED ORDER — APIXABAN 5 MG TABLET
ORAL_TABLET | Freq: Two times a day (BID) | ORAL | 11 refills | 30.00000 days | Status: CP
Start: 2024-09-10 — End: ?
  Filled 2024-09-10: qty 60, 30d supply, fill #0

## 2024-09-10 MED ORDER — FERROUS SULFATE 325 MG (65 MG IRON) TABLET
ORAL_TABLET | ORAL | 2 refills | 30.00000 days | Status: CP
Start: 2024-09-10 — End: ?
  Filled 2024-09-10: qty 15, 30d supply, fill #0

## 2024-09-11 MED ORDER — SULFAMETHOXAZOLE 400 MG-TRIMETHOPRIM 80 MG TABLET
ORAL_TABLET | Freq: Two times a day (BID) | ORAL | 0 refills | 2.00000 days | Status: CN
Start: 2024-09-11 — End: 2024-09-13

## 2024-09-11 MED ORDER — SULFAMETHOXAZOLE 800 MG-TRIMETHOPRIM 160 MG TABLET
ORAL_TABLET | Freq: Two times a day (BID) | ORAL | 0 refills | 2.00000 days | Status: CP
Start: 2024-09-11 — End: 2024-09-13
  Filled 2024-09-10: qty 4, 2d supply, fill #0

## 2024-09-13 MED ORDER — APIXABAN 5 MG TABLET
ORAL_TABLET | Freq: Two times a day (BID) | ORAL | 11 refills | 30.00000 days | Status: CP
Start: 2024-09-13 — End: ?
